# Patient Record
Sex: Female | Born: 1950
Health system: Southern US, Community
[De-identification: ages and names within clinical notes are randomized; demographics above are authoritative.]

## PROBLEM LIST (undated history)

## (undated) DIAGNOSIS — E7800 Pure hypercholesterolemia, unspecified: Secondary | ICD-10-CM

## (undated) DIAGNOSIS — I10 Essential (primary) hypertension: Secondary | ICD-10-CM

## (undated) DIAGNOSIS — G4730 Sleep apnea, unspecified: Secondary | ICD-10-CM

---

## 2004-11-16 ENCOUNTER — Ambulatory Visit: Payer: Self-pay

## 2005-09-14 DIAGNOSIS — I10 Essential (primary) hypertension: Secondary | ICD-10-CM | POA: Insufficient documentation

## 2005-11-10 ENCOUNTER — Ambulatory Visit: Payer: Self-pay | Admitting: Unknown Physician Specialty

## 2006-01-18 ENCOUNTER — Ambulatory Visit: Payer: Self-pay

## 2006-07-10 ENCOUNTER — Other Ambulatory Visit: Payer: Self-pay

## 2006-07-20 ENCOUNTER — Inpatient Hospital Stay: Payer: Self-pay

## 2006-08-22 HISTORY — PX: ABDOMINAL HYSTERECTOMY: SHX81

## 2007-02-13 ENCOUNTER — Ambulatory Visit: Payer: Self-pay

## 2008-02-15 ENCOUNTER — Ambulatory Visit: Payer: Self-pay

## 2009-02-16 ENCOUNTER — Ambulatory Visit: Payer: Self-pay

## 2010-03-04 ENCOUNTER — Ambulatory Visit: Payer: Self-pay

## 2011-03-31 ENCOUNTER — Ambulatory Visit: Payer: Self-pay

## 2013-01-25 ENCOUNTER — Ambulatory Visit: Payer: Self-pay | Admitting: Unknown Physician Specialty

## 2013-01-25 LAB — HM COLONOSCOPY

## 2013-01-28 LAB — PATHOLOGY REPORT

## 2013-03-15 ENCOUNTER — Ambulatory Visit: Payer: Self-pay

## 2013-12-26 ENCOUNTER — Ambulatory Visit: Payer: Self-pay | Admitting: Family Medicine

## 2014-03-18 LAB — HM PAP SMEAR: HM Pap smear: NEGATIVE

## 2014-04-02 ENCOUNTER — Ambulatory Visit: Payer: Self-pay

## 2014-09-23 LAB — LIPID PANEL
Cholesterol: 237 mg/dL — AB (ref 0–200)
HDL: 87 mg/dL — AB (ref 35–70)
LDL Cholesterol: 138 mg/dL
Triglycerides: 59 mg/dL (ref 40–160)

## 2014-09-23 LAB — HEMOGLOBIN A1C: Hgb A1c MFr Bld: 6.2 % — AB (ref 4.0–6.0)

## 2014-11-28 LAB — BASIC METABOLIC PANEL
BUN: 9 mg/dL (ref 4–21)
CREATININE: 0.8 mg/dL (ref 0.5–1.1)
Glucose: 112 mg/dL
Potassium: 4.1 mmol/L (ref 3.4–5.3)
Sodium: 143 mmol/L (ref 137–147)

## 2015-02-01 ENCOUNTER — Other Ambulatory Visit: Payer: Self-pay | Admitting: Family Medicine

## 2015-03-23 ENCOUNTER — Telehealth: Payer: Self-pay | Admitting: *Deleted

## 2015-03-23 NOTE — Telephone Encounter (Signed)
Patient called office wanting to know if the form she drop off last week is ready to pick-up?

## 2015-03-23 NOTE — Telephone Encounter (Signed)
What was the form for?

## 2015-03-24 NOTE — Telephone Encounter (Signed)
Patient is going to fax another copy of form to office. Patient stated that form was for consent, but did give reason for what the form was for.

## 2015-03-25 NOTE — Telephone Encounter (Signed)
Patient will pick up forms.

## 2015-05-26 DIAGNOSIS — I491 Atrial premature depolarization: Secondary | ICD-10-CM | POA: Insufficient documentation

## 2015-05-26 DIAGNOSIS — E7800 Pure hypercholesterolemia, unspecified: Secondary | ICD-10-CM | POA: Insufficient documentation

## 2015-05-26 DIAGNOSIS — J309 Allergic rhinitis, unspecified: Secondary | ICD-10-CM | POA: Insufficient documentation

## 2015-05-26 DIAGNOSIS — Z833 Family history of diabetes mellitus: Secondary | ICD-10-CM | POA: Insufficient documentation

## 2015-05-26 DIAGNOSIS — E559 Vitamin D deficiency, unspecified: Secondary | ICD-10-CM | POA: Insufficient documentation

## 2015-05-26 DIAGNOSIS — R7303 Prediabetes: Secondary | ICD-10-CM | POA: Insufficient documentation

## 2015-05-26 DIAGNOSIS — R21 Rash and other nonspecific skin eruption: Secondary | ICD-10-CM | POA: Insufficient documentation

## 2015-05-27 ENCOUNTER — Encounter: Payer: Self-pay | Admitting: Family Medicine

## 2015-05-27 ENCOUNTER — Ambulatory Visit (INDEPENDENT_AMBULATORY_CARE_PROVIDER_SITE_OTHER): Payer: BC Managed Care – PPO | Admitting: Family Medicine

## 2015-05-27 VITALS — BP 116/70 | HR 72 | Temp 98.8°F | Resp 16 | Wt 169.0 lb

## 2015-05-27 DIAGNOSIS — J309 Allergic rhinitis, unspecified: Secondary | ICD-10-CM | POA: Diagnosis not present

## 2015-05-27 DIAGNOSIS — E559 Vitamin D deficiency, unspecified: Secondary | ICD-10-CM | POA: Diagnosis not present

## 2015-05-27 DIAGNOSIS — Z23 Encounter for immunization: Secondary | ICD-10-CM

## 2015-05-27 DIAGNOSIS — I10 Essential (primary) hypertension: Secondary | ICD-10-CM

## 2015-05-27 DIAGNOSIS — R002 Palpitations: Secondary | ICD-10-CM | POA: Insufficient documentation

## 2015-05-27 DIAGNOSIS — R7303 Prediabetes: Secondary | ICD-10-CM | POA: Diagnosis not present

## 2015-05-27 LAB — HEMOGLOBIN A1C
Est. average glucose Bld gHb Est-mCnc: 123
Hemoglobin A1C: 5.9

## 2015-05-27 NOTE — Progress Notes (Signed)
Patient: Beverly Bush Female    DOB: 1951-05-10   64 y.o.   MRN: [id number] Visit Date: 05/27/2015  Today's Provider: Mila Merry, MD   Chief Complaint  Patient presents with  . Hypertension  . Allergic Rhinitis   . Vitamin d deficiency  . Hyperglycemia   Subjective:    HPI   Follow-up for vitamin D deficiency.   Last vitamin d in April had improved from 15.1 to 23.7 on vitamin d supplements. She is taking every day and tolerating without any adverse effects.    Follow-up for allergic rhinitis Has been doing well with Flonase. Has not had to use yet this fall, but was effective without adverse effects during the spring allergy season.     Hypertension, follow-up:  BP Readings from Last 3 Encounters:  05/27/15 116/70  11/28/14 120/80    She was last seen for hypertension 6 months ago.  BP at that visit was 138/70. Management since that visit includes none. She reports good compliance with treatment. She is not having side effects. none  She is exercising. She is adherent to low salt diet.   Outside blood pressures are n/a. She is experiencing none.  Patient denies palpitations.   Cardiovascular risk factors include none.  Use of agents associated with hypertension: none.     Weight trend: stable Wt Readings from Last 3 Encounters:  05/27/15 169 lb (76.658 kg)  11/28/14 170 lb (77.111 kg)    Current diet: in general, a "healthy" diet    ---------------------------------------------------------------------   Prediabetes, Follow-up:   Lab Results  Component Value Date   HGBA1C 6.2* 09/23/2014    She continue to consume healthy diet low on sweets and carbs.   Pertinent Labs:    Component Value Date/Time   CHOL 237* 09/23/2014   TRIG 59 09/23/2014   CREATININE 0.8 11/28/2014    Wt Readings from Last 3 Encounters:  05/27/15 169 lb (76.658 kg)  11/28/14 170 lb (77.111 kg)      No Known Allergies Previous Medications   ERGOCALCIFEROL (VITAMIN D2) 2000 UNITS TABS    Take 1 tablet by mouth daily.   FLUTICASONE (FLONASE) 50 MCG/ACT NASAL SPRAY    Place into the nose.   VALSARTAN-HYDROCHLOROTHIAZIDE (DIOVAN-HCT) 160-25 MG PER TABLET    Take 1 tablet by mouth daily.    Review of Systems  Constitutional: Negative for fever, chills, appetite change and fatigue.  Respiratory: Negative for chest tightness and shortness of breath.   Cardiovascular: Positive for palpitations. Negative for chest pain.  Gastrointestinal: Negative for nausea, vomiting and abdominal pain.  Neurological: Negative for dizziness, weakness and light-headedness.    Social History  Substance Use Topics  . Smoking status: Never Smoker   . Smokeless tobacco: Never Used  . Alcohol Use: No   Objective:   BP 116/70 mmHg  Pulse 72  Temp(Src) 98.8 F (37.1 C) (Oral)  Resp 16  Wt 169 lb (76.658 kg)  SpO2 97%  Physical Exam  General Appearance:    Alert, cooperative, no distress  HENT:   ENT exam normal, no neck nodes or sinus tenderness  Eyes:    PERRL, conjunctiva/corneas clear, EOM's intact       Lungs:     Clear to auscultation bilaterally, respirations unlabored  Heart:    Regular rate and rhythm  Neurologic:   Awake, alert, oriented x 3. No apparent focal neurological           defect.  Results for orders placed or performed in visit on 05/27/15  Hemoglobin A1c  Result Value Ref Range   Hemoglobin A1C 5.9    Est. average glucose Bld gHb Est-mCnc 123     Assessment & Plan:     1. Hypertension, essential, benign Fairly well controlled. Continue current medications.    2. Pre-diabetes Well controlled.  Continue current diet and exercise.  - Hemoglobin A1c  3. Vitamin D deficiency Doing well on current supplements  4. Allergic rhinitis, unspecified allergic rhinitis type Doing well with seasonal use of Flonase.   5. Need for influenza vaccination  - Flu Vaccine QUAD 36+ mos IM  Return in about 6 months  (around 11/25/2015).        Mila Merry, MD  [facility]

## 2015-09-01 ENCOUNTER — Other Ambulatory Visit: Payer: Self-pay | Admitting: Family Medicine

## 2015-09-01 MED ORDER — VALSARTAN-HYDROCHLOROTHIAZIDE 160-25 MG PO TABS: 1.0000 | ORAL_TABLET | Freq: Every day | ORAL | Status: DC

## 2015-09-01 NOTE — Telephone Encounter (Signed)
Pt stated with her insurance now she needs her medication sent to [facility] mail order. Pt would like a refill for valsartan-hydrochlorothiazide (DIOVAN-HCT) 160-25 MG per tablet sent in. Thanks TNP

## 2015-11-16 ENCOUNTER — Encounter: Payer: Self-pay | Admitting: Family Medicine

## 2015-11-16 ENCOUNTER — Ambulatory Visit (INDEPENDENT_AMBULATORY_CARE_PROVIDER_SITE_OTHER): Payer: BC Managed Care – PPO | Admitting: Family Medicine

## 2015-11-16 VITALS — BP 124/64 | HR 98 | Temp 100.9°F | Resp 16 | Wt 174.0 lb

## 2015-11-16 DIAGNOSIS — L03312 Cellulitis of back [any part except buttock]: Secondary | ICD-10-CM | POA: Diagnosis not present

## 2015-11-16 DIAGNOSIS — R509 Fever, unspecified: Secondary | ICD-10-CM

## 2015-11-16 LAB — POCT INFLUENZA A/B
INFLUENZA A, POC: NEGATIVE
Influenza B, POC: NEGATIVE

## 2015-11-16 MED ORDER — AMOXICILLIN-POT CLAVULANATE 875-125 MG PO TABS: 1.0000 | ORAL_TABLET | Freq: Two times a day (BID) | ORAL | Status: AC

## 2015-11-16 NOTE — Progress Notes (Signed)
       Patient: Beverly Bush Female    DOB: [birth year]   65 y.o.   MRN: [id number] Visit Date: 11/16/2015  Today's Provider: Mila Merryonald Warrick Llera, MD   Chief Complaint  Patient presents with  . Cough   Subjective:    Cough This is a new problem. The current episode started yesterday. The problem has been gradually worsening. The problem occurs hourly. The cough is productive of sputum. Associated symptoms include chills, a fever, headaches, a sore throat and sweats. Pertinent negatives include no chest pain, ear congestion, ear pain, heartburn, hemoptysis, myalgias, nasal congestion, postnasal drip, rash, rhinorrhea, shortness of breath or wheezing. Nothing aggravates the symptoms. Treatments tried: aleve. The treatment provided mild relief. There is no history of bronchitis or pneumonia.    Cough started yesterday. Throat hurts when she cough. She does have a low-grade fever. Cyst under left arm infected and very sore.  No Known Allergies Previous Medications   ERGOCALCIFEROL (VITAMIN D2) 2000 UNITS TABS    Take 1 tablet by mouth daily.   FLUTICASONE (FLONASE) 50 MCG/ACT NASAL SPRAY    Place into the nose.   VALSARTAN-HYDROCHLOROTHIAZIDE (DIOVAN-HCT) 160-25 MG TABLET    Take 1 tablet by mouth daily.    Review of Systems  Constitutional: Positive for fever and chills. Negative for appetite change and fatigue.  HENT: Positive for sore throat. Negative for ear pain, postnasal drip and rhinorrhea.   Respiratory: Positive for cough and chest tightness. Negative for hemoptysis, shortness of breath and wheezing.   Cardiovascular: Negative for chest pain and palpitations.  Gastrointestinal: Negative for heartburn, nausea, vomiting and abdominal pain.  Musculoskeletal: Negative for myalgias.  Skin: Negative for rash.  Neurological: Positive for headaches. Negative for dizziness and weakness.    Social History  Substance Use Topics  . Smoking status: Never Smoker   . Smokeless tobacco:  Never Used  . Alcohol Use: No   Objective:   BP 124/64 mmHg  Pulse 98  Temp(Src) 100.9 F (38.3 C) (Oral)  Resp 16  Wt 174 lb (78.926 kg)  SpO2 97%  Physical Exam  General Appearance:    Alert, cooperative, no distress  HENT:   both sides TM normal without fluid or infection, neck without nodes, sinuses nontender and nasal mucosa pale and congested  Eyes:    PERRL, conjunctiva/corneas clear, EOM's intact       Lungs:     Clear to auscultation bilaterally, respirations unlabored  Heart:    Regular rate and rhythm  Skin:   About 4cm patch of erythema with grape sized subcutaneous cystic lesion, tender to touch.       Results for orders placed or performed in visit on 11/16/15  POCT Influenza A/B  Result Value Ref Range   Influenza A, POC Negative Negative   Influenza B, POC Negative Negative         Assessment & Plan:     1. Fever, unspecified fever cause  - POCT Influenza A/B  2. Cellulitis of back except buttock  - amoxicillin-clavulanate (AUGMENTIN) 875-125 MG tablet; Take 1 tablet by mouth 2 (two) times daily.  Dispense: 20 tablet; Refill: 0  Call if symptoms change or if not rapidly improving.          Mila Merryonald Sparrow Sanzo, MD  [facility]

## 2015-11-17 ENCOUNTER — Telehealth: Payer: Self-pay | Admitting: Family Medicine

## 2015-11-17 MED ORDER — FLUTICASONE PROPIONATE 50 MCG/ACT NA SUSP: 2.0000 | Freq: Every day | NASAL | Status: DC

## 2015-11-17 MED ORDER — BENZONATATE 100 MG PO CAPS: 100.0000 mg | ORAL_CAPSULE | Freq: Two times a day (BID) | ORAL | Status: DC | PRN

## 2015-11-17 NOTE — Telephone Encounter (Signed)
Pt wants to know if she can get flonase and cough mediation/  She was in yesterday.  Coughing a lot.  [facility]  Her call back is [phone]  Thanks Barth Kirkseri

## 2015-11-17 NOTE — Telephone Encounter (Signed)
Is this okay? Beverly Bush, CMA  

## 2015-11-17 NOTE — Telephone Encounter (Signed)
[facility] 

## 2016-06-01 ENCOUNTER — Other Ambulatory Visit: Payer: Self-pay | Admitting: Obstetrics & Gynecology

## 2016-06-01 DIAGNOSIS — Z1231 Encounter for screening mammogram for malignant neoplasm of breast: Secondary | ICD-10-CM

## 2016-06-09 ENCOUNTER — Ambulatory Visit
Admission: RE | Admit: 2016-06-09 | Discharge: 2016-06-09 | Disposition: A | Discharge status: Home / Self Care | Payer: BC Managed Care – PPO | Source: Ambulatory Visit | Attending: Obstetrics & Gynecology | Admitting: Obstetrics & Gynecology

## 2016-06-09 DIAGNOSIS — Z1231 Encounter for screening mammogram for malignant neoplasm of breast: Secondary | ICD-10-CM | POA: Diagnosis not present

## 2016-06-09 LAB — HM MAMMOGRAPHY

## 2016-06-28 ENCOUNTER — Ambulatory Visit (INDEPENDENT_AMBULATORY_CARE_PROVIDER_SITE_OTHER): Payer: BC Managed Care – PPO | Admitting: Family Medicine

## 2016-06-28 ENCOUNTER — Encounter: Payer: Self-pay | Admitting: Family Medicine

## 2016-06-28 VITALS — BP 110/70 | HR 84 | Temp 98.3°F | Resp 16 | Ht 62.0 in | Wt 180.0 lb

## 2016-06-28 DIAGNOSIS — Z0000 Encounter for general adult medical examination without abnormal findings: Secondary | ICD-10-CM

## 2016-06-28 DIAGNOSIS — R7303 Prediabetes: Secondary | ICD-10-CM

## 2016-06-28 DIAGNOSIS — Z23 Encounter for immunization: Secondary | ICD-10-CM | POA: Diagnosis not present

## 2016-06-28 DIAGNOSIS — G4710 Hypersomnia, unspecified: Secondary | ICD-10-CM

## 2016-06-28 DIAGNOSIS — E559 Vitamin D deficiency, unspecified: Secondary | ICD-10-CM

## 2016-06-28 DIAGNOSIS — R002 Palpitations: Secondary | ICD-10-CM

## 2016-06-28 DIAGNOSIS — E7800 Pure hypercholesterolemia, unspecified: Secondary | ICD-10-CM

## 2016-06-28 DIAGNOSIS — R0683 Snoring: Secondary | ICD-10-CM

## 2016-06-28 DIAGNOSIS — I10 Essential (primary) hypertension: Secondary | ICD-10-CM

## 2016-06-28 NOTE — Progress Notes (Signed)
Patient: Beverly PattenDoris A Timme, Female    DOB: May 29, 1951, 65 y.o.   MRN: [id number] Visit Date: 06/28/2016  Today's Provider: Mila Merryonald Fisher, MD   Chief Complaint  Patient presents with  . Annual Exam  . Hypertension  . Hyperlipidemia   Subjective:    Annual physical exam Beverly PattenDoris A Pilot is a 65 y.o. female who presents today for health maintenance and complete physical. She feels well. She reports exercising none. She reports she is sleeping well.  She goes to [facility] and reports she has pap and mammogram done last month.   ----------------------------------------------------------------  Pre-diabetes From 05/27/2015-Well controlled.  Continue current diet and exercise.    Vitamin D deficiency From 05/27/2015-Doing well on current supplements   Hypertension:  BP Readings from Last 3 Encounters:  06/28/16 110/70  11/16/15 124/64  05/27/15 116/70    She was last seen for hypertension 1 years ago.  BP at that visit was 116/70. Management since that visit includes; no changes.She reports good compliance with treatment. She is not having side effects. none She is not exercising. She is not adherent to low salt diet.   Outside blood pressures are n/a. She is experiencing none.  Patient denies none.   Cardiovascular risk factors include prediabetes.  Use of agents associated with hypertension: none.   ----------------------------------------------------------------    Lipid/Cholesterol, Follow-up:   Last seen for this 1 years ago.  Management since that visit includes; advised to cut back on saturated fats.  Last Lipid Panel:    Component Value Date/Time   CHOL 237 (A) 09/23/2014   TRIG 59 09/23/2014   HDL 87 (A) 09/23/2014   LDLCALC 138 09/23/2014    She reports good compliance with treatment. She is not having side effects. none  Wt Readings from Last 3 Encounters:  06/28/16 180 lb (81.6 kg)  11/16/15 174 lb (78.9 kg)  05/27/15 169 lb (76.7  kg)    ----------------------------------------------------------------     Review of Systems  Constitutional: Positive for diaphoresis.  HENT: Negative.   Eyes: Negative.   Respiratory: Positive for cough.   Cardiovascular: Positive for palpitations.  Gastrointestinal: Negative.   Endocrine: Positive for polyuria.  Genitourinary: Negative.   Musculoskeletal: Negative.   Skin: Negative.   Allergic/Immunologic: Negative.   Neurological: Negative.   Hematological: Negative.   Psychiatric/Behavioral: Negative.     Social History      She  reports that she has never smoked. She has never used smokeless tobacco. She reports that she does not drink alcohol or use drugs.       Social History   Social History  . Marital status: Married    Spouse name: N/A  . Number of children: 2  . Years of education: College Gr   Social History Main Topics  . Smoking status: Never Smoker  . Smokeless tobacco: Never Used  . Alcohol use No  . Drug use: No  . Sexual activity: Not Asked   Other Topics Concern  . None   Social History Narrative  . None    History reviewed. No pertinent past medical history.   Patient Active Problem List   Diagnosis Date Noted  . Palpitations 05/27/2015  . Allergic rhinitis 05/26/2015  . Family history of diabetes mellitus 05/26/2015  . Ectopic atrial beats 05/26/2015  . Pre-diabetes 05/26/2015  . Pure hypercholesterolemia 05/26/2015  . Vitamin D deficiency 05/26/2015  . Depressive disorder 04/24/2009  . Urge incontinence 04/22/2008  . Postprocedural state 01/30/2007  .  Hypertension, essential, benign 09/14/2005    Past Surgical History:  Procedure Laterality Date  . ABDOMINAL HYSTERECTOMY  2008   Due to heavy bleeding. Complete; does not have ovaries per patient    Family History        Family Status  Relation Status  . Mother Alive  . Father Alive  . Sister Alive  . Sister Alive        Her family history includes Breast cancer  in her sister; Cancer in her sister and sister; Diabetes in her mother; Hypertension in her father.     No Known Allergies   Current Outpatient Prescriptions:  .  benzonatate (TESSALON) 100 MG capsule, Take 1 capsule (100 mg total) by mouth 2 (two) times daily as needed for cough., Disp: 20 capsule, Rfl: 0 .  Ergocalciferol (VITAMIN D2) 2000 UNITS TABS, Take 1 tablet by mouth daily., Disp: , Rfl:  .  fluticasone (FLONASE) 50 MCG/ACT nasal spray, Place 2 sprays into both nostrils daily., Disp: 16 g, Rfl: 1 .  valsartan-hydrochlorothiazide (DIOVAN-HCT) 160-25 MG tablet, Take 1 tablet by mouth daily., Disp: 90 tablet, Rfl: 4   Patient Care Team: Malva Limes, MD as PCP - General (Family Medicine)      Objective:   Vitals: BP 110/70 (BP Location: Left Arm, Patient Position: Sitting, Cuff Size: Normal)   Pulse 84   Temp 98.3 F (36.8 C) (Oral)   Resp 16   Ht 5\' 2"  (1.575 m)   Wt 180 lb (81.6 kg)   SpO2 98%   BMI 32.92 kg/m    Physical Exam   General Appearance:    Alert, cooperative, no distress, appears stated age, overweight  Head:    Normocephalic, without obvious abnormality, atraumatic  Eyes:    PERRL, conjunctiva/corneas clear, EOM's intact, fundi    benign, both eyes  Ears:    Normal TM's and external ear canals, both ears  Nose:   Nares normal, septum midline, mucosa normal, no drainage    or sinus tenderness  Throat:   Lips, mucosa, and tongue normal; teeth and gums normal  Neck:   Supple, symmetrical, trachea midline, no adenopathy;    thyroid:  no enlargement/tenderness/nodules; no carotid   bruit or JVD  Back:     Symmetric, no curvature, ROM normal, no CVA tenderness  Lungs:     Clear to auscultation bilaterally, respirations unlabored  Chest Wall:    No tenderness or deformity   Heart:    Regular rate and rhythm, S1 and S2 normal, no murmur, rub   or gallop  Breast Exam:    deferred (had gyn)  Abdomen:     Soft, non-tender, bowel sounds active all four  quadrants,    no masses, no organomegaly  Pelvic:    deferred (has gyn)  Extremities:   Extremities normal, atraumatic, no cyanosis or edema  Pulses:   2+ and symmetric all extremities  Skin:   Skin color, texture, turgor normal, no rashes or lesions  Lymph nodes:   Cervical, supraclavicular, and axillary nodes normal  Neurologic:   CNII-XII intact, normal strength, sensation and reflexes    throughout    Depression Screen PHQ 2/9 Scores 06/28/2016  PHQ - 2 Score 1  PHQ- 9 Score 3    Epworth=17  Assessment & Plan:     Routine Health Maintenance and Physical Exam  Exercise Activities and Dietary recommendations Goals    None      Immunization History  Administered Date(s) Administered  .  Influenza,inj,Quad PF,36+ Mos 05/27/2015  . Tdap 04/22/2008  . Zoster 09/23/2014    Health Maintenance  Topic Date Due  . HIV Screening  06/07/1968  . COLONOSCOPY  01/25/2018  . TETANUS/TDAP  04/22/2018  . MAMMOGRAM  06/09/2018  . INFLUENZA VACCINE  Completed  . ZOSTAVAX  Completed  . Hepatitis C Screening  Completed     Discussed health benefits of physical activity, and encouraged her to engage in regular exercise appropriate for her age and condition.    --------------------------------------------------------------------  1. Annual physical exam  - Comprehensive metabolic panel  2. Need for influenza vaccination  - Flu Vaccine QUAD 36+ mos IM  3. Hypertension, essential, benign Well controlled.  Continue current medications.   - EKG 12-Lead  4. Vitamin D deficiency  - VITAMIN D 25 Hydroxy (Vit-D Deficiency, Fractures)  5. Pure hypercholesterolemia On low fat diet - Lipid panel  6. Pre-diabetes  - Hemoglobin A1c  7. Palpitations  - T4 AND TSH  8. Snoring   9. Hypersomnia  - Polysomnography 4 or more parameters; Future    Mila Merryonald Fisher, MD  [facility]

## 2016-06-30 ENCOUNTER — Telehealth: Payer: Self-pay

## 2016-06-30 LAB — LIPID PANEL
CHOLESTEROL TOTAL: 211 mg/dL — AB (ref 100–199)
Chol/HDL Ratio: 2.9 ratio units (ref 0.0–4.4)
HDL: 74 mg/dL (ref >39)
LDL Calculated: 125 mg/dL — ABNORMAL HIGH (ref 0–99)
Triglycerides: 62 mg/dL (ref 0–149)
VLDL CHOLESTEROL CAL: 12 mg/dL (ref 5–40)

## 2016-06-30 LAB — COMPREHENSIVE METABOLIC PANEL
ALBUMIN: 3.9 g/dL (ref 3.6–4.8)
ALK PHOS: 61 IU/L (ref 39–117)
ALT: 19 IU/L (ref 0–32)
AST: 19 IU/L (ref 0–40)
Albumin/Globulin Ratio: 1.4 (ref 1.2–2.2)
BILIRUBIN TOTAL: 0.8 mg/dL (ref 0.0–1.2)
BUN / CREAT RATIO: 20 (ref 12–28)
BUN: 17 mg/dL (ref 8–27)
CHLORIDE: 105 mmol/L (ref 96–106)
CO2: 24 mmol/L (ref 18–29)
Calcium: 9.1 mg/dL (ref 8.7–10.3)
Creatinine, Ser: 0.85 mg/dL (ref 0.57–1.00)
GFR calc Af Amer: 84 mL/min/{1.73_m2} (ref >59)
GFR calc non Af Amer: 73 mL/min/{1.73_m2} (ref >59)
GLOBULIN, TOTAL: 2.8 g/dL (ref 1.5–4.5)
Glucose: 121 mg/dL — ABNORMAL HIGH (ref 65–99)
Potassium: 4.2 mmol/L (ref 3.5–5.2)
SODIUM: 144 mmol/L (ref 134–144)
Total Protein: 6.7 g/dL (ref 6.0–8.5)

## 2016-06-30 LAB — VITAMIN D 25 HYDROXY (VIT D DEFICIENCY, FRACTURES): VIT D 25 HYDROXY: 34.4 ng/mL (ref 30.0–100.0)

## 2016-06-30 LAB — T4 AND TSH
T4 TOTAL: 7 ug/dL (ref 4.5–12.0)
TSH: 3.16 u[IU]/mL (ref 0.450–4.500)

## 2016-06-30 LAB — HEMOGLOBIN A1C
ESTIMATED AVERAGE GLUCOSE: 126 mg/dL
HEMOGLOBIN A1C: 6 % — AB (ref 4.8–5.6)

## 2016-06-30 NOTE — Telephone Encounter (Signed)
-----   Message from Malva Limesonald E Fisher, MD sent at 06/30/2016  8:28 AM EST ----- Cholesterol slightly elevated at 211. hgba1c is stable at 6.0 indicating average blood sugar of 126. All other labs are completley normal. Continue current medications.  Check labs yearly.

## 2016-06-30 NOTE — Telephone Encounter (Signed)
Pt advised.   Thanks,   -Laura  

## 2016-06-30 NOTE — Telephone Encounter (Signed)
LMTCB 06/30/2016  Thanks,   -Laura  

## 2016-09-09 ENCOUNTER — Ambulatory Visit: Payer: BC Managed Care – PPO | Attending: Otolaryngology

## 2016-09-09 DIAGNOSIS — I10 Essential (primary) hypertension: Secondary | ICD-10-CM | POA: Insufficient documentation

## 2016-09-09 DIAGNOSIS — G4700 Insomnia, unspecified: Secondary | ICD-10-CM | POA: Insufficient documentation

## 2016-09-09 DIAGNOSIS — G4733 Obstructive sleep apnea (adult) (pediatric): Secondary | ICD-10-CM | POA: Diagnosis not present

## 2016-09-16 ENCOUNTER — Telehealth: Payer: Self-pay | Admitting: Family Medicine

## 2016-09-16 DIAGNOSIS — G4733 Obstructive sleep apnea (adult) (pediatric): Secondary | ICD-10-CM | POA: Insufficient documentation

## 2016-09-16 NOTE — Telephone Encounter (Signed)
Sleep test shows severe sleep apnea. Needs CPAP titrate study. Have entered order and should hear from sarah about this within the next week.

## 2016-09-19 NOTE — Telephone Encounter (Signed)
Patient was notified of results. Please schedule CPAP titrate study. Thanks!

## 2016-09-19 NOTE — Telephone Encounter (Signed)
No answer and unable to leave a message

## 2016-09-21 ENCOUNTER — Encounter: Payer: Self-pay | Admitting: Family Medicine

## 2016-09-21 NOTE — Telephone Encounter (Signed)
Order for CPAP titration faxed to [facility] 09/20/16

## 2016-09-23 ENCOUNTER — Encounter: Payer: Self-pay | Admitting: Family Medicine

## 2016-10-17 ENCOUNTER — Other Ambulatory Visit: Payer: Self-pay

## 2016-10-17 DIAGNOSIS — I10 Essential (primary) hypertension: Secondary | ICD-10-CM

## 2016-10-17 MED ORDER — VALSARTAN-HYDROCHLOROTHIAZIDE 160-25 MG PO TABS: 1.0000 | ORAL_TABLET | Freq: Every day | ORAL | 4 refills | Status: DC

## 2016-10-17 NOTE — Telephone Encounter (Signed)
Pharmacy sent fax requesting refill. 

## 2016-10-18 ENCOUNTER — Ambulatory Visit: Payer: BC Managed Care – PPO | Attending: Otolaryngology

## 2016-10-18 DIAGNOSIS — R0683 Snoring: Secondary | ICD-10-CM | POA: Insufficient documentation

## 2016-10-18 DIAGNOSIS — G4733 Obstructive sleep apnea (adult) (pediatric): Secondary | ICD-10-CM | POA: Insufficient documentation

## 2016-10-25 ENCOUNTER — Encounter: Payer: Self-pay | Admitting: Family Medicine

## 2016-10-27 ENCOUNTER — Telehealth: Payer: Self-pay | Admitting: Family Medicine

## 2016-10-27 NOTE — Telephone Encounter (Signed)
error 

## 2016-11-09 ENCOUNTER — Other Ambulatory Visit: Payer: Self-pay | Admitting: Family Medicine

## 2016-11-09 MED ORDER — VALSARTAN-HYDROCHLOROTHIAZIDE 160-25 MG PO TABS: 1.0000 | ORAL_TABLET | Freq: Every day | ORAL | 4 refills | Status: DC

## 2016-11-09 NOTE — Telephone Encounter (Signed)
Pt stated that the Rx for valsartan-hydrochlorothiazide (DIOVAN-HCT) 160-25 MG tablet [facility] and it should have been [facility]. Please advise. Thanks TNP

## 2017-03-28 ENCOUNTER — Telehealth: Payer: Self-pay | Admitting: Family Medicine

## 2017-03-28 MED ORDER — TELMISARTAN-HCTZ 80-25 MG PO TABS: 1.0000 | ORAL_TABLET | Freq: Every day | ORAL | 0 refills | Status: DC

## 2017-03-28 NOTE — Telephone Encounter (Signed)
Have sent prescription for telmisartan-hctz to [facility] to try in its place. If she is tolerating this after 2 weeks, she should call for mail order prescription.

## 2017-03-28 NOTE — Telephone Encounter (Signed)
Called both numbers no answer and unable to leave a message-aa

## 2017-03-28 NOTE — Telephone Encounter (Signed)
Pt called about the Valsartin recall wanting to know what she needs to do regarding this prescription.  Her call back [phone] or [phone]  Thanks Barth Kirksteri

## 2017-03-29 NOTE — Telephone Encounter (Signed)
Pt advised as below-aa 

## 2017-04-25 ENCOUNTER — Telehealth: Payer: Self-pay | Admitting: Family Medicine

## 2017-04-25 NOTE — Telephone Encounter (Signed)
Tried calling patient to advise as below. Unable to leave a message due to VM being full.

## 2017-04-25 NOTE — Telephone Encounter (Signed)
Please call pharmacy to clarify. I don't understand which medication pharmacist was talking about. . She can go back to valsartan-hctz if pharmacy has some that are not on recall list.

## 2017-04-25 NOTE — Telephone Encounter (Signed)
Called [facility] to clarify and spoke with Velna HatchetSheila (the pharmacist). She reports that Valsartan was recalled, but they now have a new manufacturer and it is ok to take now. She reports that patient can continue to take her original Rx.

## 2017-04-25 NOTE — Telephone Encounter (Signed)
Pt states she received Valsartan in the mail with her mail order RX.  She states she was told to not take it due to recall and she was started on a new medication.  When the pt called the pharmacy they told her they have ordered from another manufacturer. Pt is confused on what medication to take and if she should continue Valsartan or not.

## 2017-04-25 NOTE — Telephone Encounter (Signed)
Please advise 

## 2017-05-04 NOTE — Telephone Encounter (Signed)
Patient is aware. Patient stated that she is still taking generic Micardis HCT for now but she will eventually go back to valsartan-hct.

## 2017-06-27 ENCOUNTER — Encounter: Payer: Self-pay | Admitting: Obstetrics & Gynecology

## 2017-06-27 ENCOUNTER — Ambulatory Visit (INDEPENDENT_AMBULATORY_CARE_PROVIDER_SITE_OTHER): Payer: BC Managed Care – PPO | Admitting: Obstetrics & Gynecology

## 2017-06-27 VITALS — BP 120/80 | HR 78 | Ht 62.0 in | Wt 177.0 lb

## 2017-06-27 DIAGNOSIS — Z124 Encounter for screening for malignant neoplasm of cervix: Secondary | ICD-10-CM

## 2017-06-27 DIAGNOSIS — N3941 Urge incontinence: Secondary | ICD-10-CM

## 2017-06-27 DIAGNOSIS — Z0000 Encounter for general adult medical examination without abnormal findings: Secondary | ICD-10-CM

## 2017-06-27 DIAGNOSIS — Z1231 Encounter for screening mammogram for malignant neoplasm of breast: Secondary | ICD-10-CM | POA: Diagnosis not present

## 2017-06-27 DIAGNOSIS — Z1239 Encounter for other screening for malignant neoplasm of breast: Secondary | ICD-10-CM

## 2017-06-27 DIAGNOSIS — Z01419 Encounter for gynecological examination (general) (routine) without abnormal findings: Secondary | ICD-10-CM

## 2017-06-27 DIAGNOSIS — Z1211 Encounter for screening for malignant neoplasm of colon: Secondary | ICD-10-CM

## 2017-06-27 NOTE — Patient Instructions (Signed)
PAP every 5 years Mammogram every year    Call [phone] to schedule at [facility] every 5 years Labs yearly (with PCP)   

## 2017-06-27 NOTE — Progress Notes (Signed)
HPI:      [name] is a 66 y.o. G3P2 who LMP was in the past, she presents today for her annual examination.  The patient has no complaints today. The patient is not sexually active. Herlast pap: was normal and last mammogram: approximate date 2017 and was normal.  The patient does perform self breast exams.  There is no notable family history of breast or ovarian cancer in her family. The patient is not taking hormone replacement therapy. Patient denies post-menopausal vaginal bleeding.   The patient has regular exercise: yes. The patient denies current symptoms of depression.    GYN Hx: Last Colonoscopy:4 years ago. Normal.  Last DEXA: never ago.    PMHx: History reviewed. No pertinent past medical history. Past Surgical History:  Procedure Laterality Date  . ABDOMINAL HYSTERECTOMY  2008   Due to heavy bleeding. Complete; does not have ovaries per patient   Family History  Problem Relation Age of Onset  . Diabetes Mother        non insulin dependant diabetes mellitus  . Hypertension Father   . Cancer Sister        breast  . Breast cancer Sister   . Cancer Sister        colon cancer  . Breast cancer Sister    Social History   Tobacco Use  . Smoking status: Never Smoker  . Smokeless tobacco: Never Used  Substance Use Topics  . Alcohol use: No  . Drug use: No    Current Outpatient Medications:  .  Ergocalciferol (VITAMIN D2) 2000 UNITS TABS, Take 1 tablet by mouth daily., Disp: , Rfl:  .  fluticasone (FLONASE) 50 MCG/ACT nasal spray, Place 2 sprays into both nostrils daily., Disp: 16 g, Rfl: 1 .  telmisartan-hydrochlorothiazide (MICARDIS HCT) 80-25 MG tablet, Take 1 tablet by mouth daily., Disp: 30 tablet, Rfl: 0 .  valsartan-hydrochlorothiazide (DIOVAN-HCT) 160-25 MG tablet, Take 1 tablet by mouth daily., Disp: 90 tablet, Rfl: 4 Allergies: Patient has no known allergies.  Review of Systems  Constitutional: Negative for chills, fever and malaise/fatigue.    HENT: Negative for congestion, sinus pain and sore throat.   Eyes: Negative for blurred vision and pain.  Respiratory: Negative for cough and wheezing.   Cardiovascular: Negative for chest pain and leg swelling.  Gastrointestinal: Negative for abdominal pain, constipation, diarrhea, heartburn, nausea and vomiting.  Genitourinary: Negative for dysuria, frequency, hematuria and urgency.  Musculoskeletal: Negative for back pain, joint pain, myalgias and neck pain.  Skin: Negative for itching and rash.  Neurological: Negative for dizziness, tremors and weakness.  Endo/Heme/Allergies: Does not bruise/bleed easily.  Psychiatric/Behavioral: Negative for depression. The patient is not nervous/anxious and does not have insomnia.    Objective: BP 120/80   Pulse 78   Ht 5\' 2"  (1.575 m)   Wt 177 lb (80.3 kg)   BMI 32.37 kg/m   Filed Weights   06/27/17 1440  Weight: 177 lb (80.3 kg)   Body mass index is 32.37 kg/m. Physical Exam  Constitutional: She is oriented to person, place, and time. She appears well-developed and well-nourished. No distress.  Genitourinary: Rectum normal and vagina normal. Pelvic exam was performed with patient supine. There is no rash or lesion on the right labia. There is no rash or lesion on the left labia. Vagina exhibits no lesion. No bleeding in the vagina. Right adnexum does not display mass and does not display tenderness. Left adnexum does not display mass and does not display  tenderness.  Genitourinary Comments: Absent Uterus Absent cervix Vaginal cuff well healed  HENT:  Head: Normocephalic and atraumatic. Head is without laceration.  Right Ear: Hearing normal.  Left Ear: Hearing normal.  Nose: No epistaxis.  No foreign bodies.  Mouth/Throat: Uvula is midline, oropharynx is clear and moist and mucous membranes are normal.  Eyes: Pupils are equal, round, and reactive to light.  Neck: Normal range of motion. Neck supple. No thyromegaly present.   Cardiovascular: Normal rate and regular rhythm. Exam reveals no gallop and no friction rub.  No murmur heard. Pulmonary/Chest: Effort normal and breath sounds normal. No respiratory distress. She has no wheezes. Right breast exhibits no mass, no skin change and no tenderness. Left breast exhibits no mass, no skin change and no tenderness.  Abdominal: Soft. Bowel sounds are normal. She exhibits no distension. There is no tenderness. There is no rebound.  Musculoskeletal: Normal range of motion.  Neurological: She is alert and oriented to person, place, and time. No cranial nerve deficit.  Skin: Skin is warm and dry.  Psychiatric: She has a normal mood and affect. Judgment normal.  Vitals reviewed.  Assessment: Annual Exam 1. Annual physical exam   2. Urge incontinence   3. Screening for cervical cancer   4. Screening for breast cancer   5. Screen for colon cancer    Plan:            1.  Cervical Screening-  Pap smear done today, Pap smear schedule reviewed with patient  2. Breast screening- Exam annually and mammogram scheduled  3. Colonoscopy every 10 years, Hemoccult testing after age 64  4. Labs managed by PCP  5. Counseling for hormonal therapy: none, no change in therapy today    F/U  Return in about 1 year (around 06/27/2018) for Annual.  Annamarie MajorPaul Amaziah Ghosh, MD, Merlinda FrederickFACOG [facility] 06/27/2017  3:23 PM

## 2017-06-29 LAB — PAP IG (IMAGE GUIDED): PAP SMEAR COMMENT: 0

## 2017-07-03 LAB — SPECIMEN STATUS REPORT

## 2017-07-03 LAB — FECAL OCCULT BLOOD, IMMUNOCHEMICAL: Fecal Occult Bld: NEGATIVE

## 2017-07-24 ENCOUNTER — Ambulatory Visit
Admission: RE | Admit: 2017-07-24 | Discharge: 2017-07-24 | Disposition: A | Discharge status: Home / Self Care | Payer: BC Managed Care – PPO | Source: Ambulatory Visit | Attending: Obstetrics & Gynecology | Admitting: Obstetrics & Gynecology

## 2017-07-24 DIAGNOSIS — Z1231 Encounter for screening mammogram for malignant neoplasm of breast: Secondary | ICD-10-CM | POA: Insufficient documentation

## 2017-07-24 DIAGNOSIS — Z1239 Encounter for other screening for malignant neoplasm of breast: Secondary | ICD-10-CM

## 2017-10-24 ENCOUNTER — Other Ambulatory Visit: Payer: Self-pay | Admitting: Family Medicine

## 2017-10-24 MED ORDER — VALSARTAN-HYDROCHLOROTHIAZIDE 160-25 MG PO TABS: 1.0000 | ORAL_TABLET | Freq: Every day | ORAL | 0 refills | Status: DC

## 2017-10-24 NOTE — Telephone Encounter (Signed)
Please advise patient that we sent one refil for her bp medication to mail-order, but she is overdue for follow up and needs to schedule o.v. Or CPE in the next month.

## 2017-11-06 ENCOUNTER — Telehealth: Payer: Self-pay

## 2017-11-06 MED ORDER — TELMISARTAN-HCTZ 40-12.5 MG PO TABS: 1.0000 | ORAL_TABLET | Freq: Every day | ORAL | 3 refills | Status: DC

## 2017-11-06 NOTE — Telephone Encounter (Signed)
Advised patient as below. Medication was sent into the pharmacy. Valsartan was discontinued.

## 2017-11-06 NOTE — Telephone Encounter (Signed)
Can change to telmisartan-hctz 40-12.5 one tablet daily, can 30 to local pharmacy and 90 to mail order if she likes.

## 2017-11-06 NOTE — Telephone Encounter (Signed)
Patient is currently taking Valsartan. Please advise. Thanks!

## 2017-11-06 NOTE — Telephone Encounter (Signed)
Patient reports she has not been able to receive her blood pressure medication. Patient reports that her mail order pharmacy sent out a letter for her to contact PCP for possible change in medication. sd  CB# [phone] [facility]

## 2018-03-19 ENCOUNTER — Encounter: Payer: Self-pay | Admitting: Family Medicine

## 2018-04-05 NOTE — Progress Notes (Signed)
Patient: Beverly PattenDoris A Portner, Female    DOB: [birth year], 67 y.o.   MRN: [id number] Visit Date: 04/09/2018  Today's Provider: Mila Merryonald Fisher, MD   Chief Complaint  Patient presents with  . Annual Exam  . Hypertension  . Hyperlipidemia   Subjective:    Annual physical exam Beverly Bush is a 67 y.o. female who presents today for health maintenance and complete physical. She feels well. She reports exercising some. She reports she is sleeping poorly.  ---------------------------------------------------------------   Hypertension, follow-up:  BP Readings from Last 3 Encounters:  04/09/18 125/74  06/27/17 120/80  06/28/16 110/70    She was last seen for hypertension 06/28/2016.  BP at that visit was 110/70. Management since that visit includes; no changes. Changes since last ov. Changed to Telmisartan-HCTZ 40-12.5 mg qd. Discontinued Valsratn.She reports good compliance with treatment. She is not having side effects. none She is not exercising. She is not adherent to low salt diet.   Outside blood pressures are not checking. She is experiencing none.  Patient denies none.   Cardiovascular risk factors include none.  Use of agents associated with hypertension: none.   ---------------------------------------------------------------    Lipid/Cholesterol, Follow-up:   Last seen for this 06/28/2016.  Management since that visit includes; labs checked, no changes.  Last Lipid Panel:    Component Value Date/Time   CHOL 211 (H) 06/29/2016 0837   TRIG 62 06/29/2016 0837   HDL 74 06/29/2016 0837   CHOLHDL 2.9 06/29/2016 0837   LDLCALC 125 (H) 06/29/2016 0837    She reports good compliance with treatment. She is not having side effects. none  Wt Readings from Last 3 Encounters:  04/09/18 181 lb (82.1 kg)  06/27/17 177 lb (80.3 kg)  06/28/16 180 lb (81.6 kg)    ---------------------------------------------------------------   Vitamin D deficiency From  06/28/2016-labs checked, no changes.   Pre-diabetes From 06/28/2016-labs checked, no changes. Hemoglobin A1c 6.0.  OSA From 09/09/2016-Sleep study done-showing severe sleep apnea. Had CPAP titration and started on 14cm pressure.  Which seem to help for awhile, but she hasn't used for about a month because it didn't seem to help anymore  She also complains she frequently gets up in the morning and her right shoulder and leg are sore and stiff and hurt to move to limits of ROM.    Review of Systems  Constitutional: Positive for fatigue.  Respiratory: Positive for apnea.   Cardiovascular: Positive for palpitations.  Genitourinary: Positive for frequency.  Musculoskeletal: Positive for arthralgias.  All other systems reviewed and are negative.   Social History      She  reports that she has never smoked. She has never used smokeless tobacco. She reports that she does not drink alcohol or use drugs.       Social History   Socioeconomic History  . Marital status: Married    Spouse name: Not on file  . Number of children: 2  . Years of education: College Gr  . Highest education level: Not on file  Occupational History  . Not on file  Social Needs  . Financial resource strain: Not on file  . Food insecurity:    Worry: Not on file    Inability: Not on file  . Transportation needs:    Medical: Not on file    Non-medical: Not on file  Tobacco Use  . Smoking status: Never Smoker  . Smokeless tobacco: Never Used  Substance and Sexual Activity  . Alcohol  use: No  . Drug use: No  . Sexual activity: Not Currently  Lifestyle  . Physical activity:    Days per week: Not on file    Minutes per session: Not on file  . Stress: Not on file  Relationships  . Social connections:    Talks on phone: Not on file    Gets together: Not on file    Attends religious service: Not on file    Active member of club or organization: Not on file    Attends meetings of clubs or organizations: Not  on file    Relationship status: Not on file  Other Topics Concern  . Not on file  Social History Narrative  . Not on file    History reviewed. No pertinent past medical history.   Patient Active Problem List   Diagnosis Date Noted  . OSA (obstructive sleep apnea) 09/16/2016  . Palpitations 05/27/2015  . Allergic rhinitis 05/26/2015  . Family history of diabetes mellitus 05/26/2015  . Ectopic atrial beats 05/26/2015  . Pre-diabetes 05/26/2015  . Pure hypercholesterolemia 05/26/2015  . Vitamin D deficiency 05/26/2015  . Depressive disorder 04/24/2009  . Urge incontinence 04/22/2008  . Postprocedural state 01/30/2007  . Hypertension, essential, benign 09/14/2005    Past Surgical History:  Procedure Laterality Date  . ABDOMINAL HYSTERECTOMY  2008   Due to heavy bleeding. Complete; does not have ovaries per patient    Family History        Family Status  Relation Name Status  . Mother  Alive  . Father  Alive  . Sister 1 Alive  . Sister 2 Alive  . Emelda BrothersPat Aunt  Deceased        Her family history includes Breast cancer in her paternal aunt, sister, and sister; Cancer in her sister and sister; Diabetes in her mother; Hypertension in her father.      No Known Allergies   Current Outpatient Medications:  .  Ergocalciferol (VITAMIN D2) 2000 UNITS TABS, Take 1 tablet by mouth daily., Disp: , Rfl:  .  telmisartan-hydrochlorothiazide (MICARDIS HCT) 40-12.5 MG tablet, Take 1 tablet by mouth daily., Disp: 90 tablet, Rfl: 3 .  fluticasone (FLONASE) 50 MCG/ACT nasal spray, Place 2 sprays into both nostrils daily. (Patient not taking: Reported on 04/09/2018), Disp: 16 g, Rfl: 1   Patient Care Team: Malva LimesFisher, Donald E, MD as PCP - [facility] (Family Medicine) [facility]      Objective:   Vitals: BP 125/74 (BP Location: Right Arm, Patient Position: Sitting, Cuff Size: Large)   Pulse 80   Temp 97.9 F (36.6 C) (Oral)   Resp 16   Ht 5\' 2"  (1.575 m)   Wt 181 lb (82.1  kg)   SpO2 96%   BMI 33.11 kg/m    Vitals:   04/09/18 1412  BP: 125/74  Pulse: 80  Resp: 16  Temp: 97.9 F (36.6 C)  TempSrc: Oral  SpO2: 96%  Weight: 181 lb (82.1 kg)  Height: 5\' 2"  (1.575 m)     Physical Exam   General Appearance:    Alert, cooperative, no distress, appears stated age  Head:    Normocephalic, without obvious abnormality, atraumatic  Eyes:    PERRL, conjunctiva/corneas clear, EOM's intact, fundi    benign, both eyes  Ears:    Normal TM's and external ear canals, both ears  Nose:   Nares normal, septum midline, mucosa pale and congested, no drainage    or sinus tenderness  Throat:  Lips, mucosa, and tongue normal; teeth and gums normal  Neck:   Supple, symmetrical, trachea midline, no adenopathy;    thyroid:  no enlargement/tenderness/nodules; no carotid   bruit or JVD  Back:     Symmetric, no curvature, ROM normal, no CVA tenderness  Lungs:     Clear to auscultation bilaterally, respirations unlabored  Chest Wall:    No tenderness or deformity   Heart:    Regular rate and rhythm, S1 and S2 normal, no murmur, rub   or gallop  Breast Exam:    deferred  Abdomen:     Soft, non-tender, bowel sounds active all four quadrants,    no masses, no organomegaly  Pelvic:    deferred  Extremities:   Extremities normal, atraumatic, no cyanosis or edema  Pulses:   2+ and symmetric all extremities  Skin:   Skin color, texture, turgor normal, no rashes or lesions  Lymph nodes:   Cervical, supraclavicular, and axillary nodes normal  Neurologic:   CNII-XII intact, normal strength, sensation and reflexes    throughout    Depression Screen PHQ 2/9 Scores 04/09/2018 06/28/2016  PHQ - 2 Score 0 1  PHQ- 9 Score 2 3      Assessment & Plan:     Routine Health Maintenance and Physical Exam  Exercise Activities and Dietary recommendations Goals   None     Immunization History  Administered Date(s) Administered  . Influenza,inj,Quad PF,6+ Mos 05/27/2015,  06/28/2016  . Tdap 04/22/2008  . Zoster 09/23/2014    Health Maintenance  Topic Date Due  . HIV Screening  06/07/1966  . COLONOSCOPY  01/25/2018  . INFLUENZA VACCINE  03/22/2018  . TETANUS/TDAP  04/22/2018  . MAMMOGRAM  07/25/2019  . Hepatitis C Screening  Completed     Discussed health benefits of physical activity, and encouraged her to engage in regular exercise appropriate for her age and condition.    --------------------------------------------------------------------  1. Annual physical exam Generally doing well. Followed at [facility] for pap.pelvic/breast exams. History of colon polyp per Dr. Markham Jordan and is due for follow up colonoscopy this year.   2. Hypertension, essential, benign Well controlled.  Continue current medications.   - Td vaccine greater than or equal to 7yo preservative free IM - EKG 12-Lead  3. Pure hypercholesterolemia She is tolerating lovastatin well with no adverse effects.   - Comprehensive metabolic panel - CBC - Lipid panel  4. Pre-diabetes  - Hemoglobin A1c  5. Vitamin D deficiency  - VITAMIN D 25 Hydroxy (Vit-D Deficiency, Fractures)  6. Need for prophylactic vaccination using tetanus and diphtheria toxoids adsorbed (Td) vaccine Td  7. Need for shingles vaccine Shingrix #1 today.   8. OSA (obstructive sleep apnea) Has not been using CPAP because she is not sure it is still helping. Advised to start using consistently for the next month. If not feeling considerable better than call to have repeat CPAP titration study which could probably be done at her home.   9. Strain of right shoulder, sequela Recommend OTC naprosyn   10. Allergic rhinitis due to pollen, unspecified seasonality Start back on prescription fluticasone nasal spray.    Mila Merry, MD  [facility]

## 2018-04-09 ENCOUNTER — Encounter: Payer: Self-pay | Admitting: Family Medicine

## 2018-04-09 ENCOUNTER — Ambulatory Visit (INDEPENDENT_AMBULATORY_CARE_PROVIDER_SITE_OTHER): Payer: BC Managed Care – PPO | Admitting: Family Medicine

## 2018-04-09 VITALS — BP 125/74 | HR 80 | Temp 97.9°F | Resp 16 | Ht 62.0 in | Wt 181.0 lb

## 2018-04-09 DIAGNOSIS — G4733 Obstructive sleep apnea (adult) (pediatric): Secondary | ICD-10-CM

## 2018-04-09 DIAGNOSIS — I10 Essential (primary) hypertension: Secondary | ICD-10-CM

## 2018-04-09 DIAGNOSIS — S46911S Strain of unspecified muscle, fascia and tendon at shoulder and upper arm level, right arm, sequela: Secondary | ICD-10-CM

## 2018-04-09 DIAGNOSIS — Z23 Encounter for immunization: Secondary | ICD-10-CM | POA: Diagnosis not present

## 2018-04-09 DIAGNOSIS — Z0000 Encounter for general adult medical examination without abnormal findings: Secondary | ICD-10-CM

## 2018-04-09 DIAGNOSIS — J301 Allergic rhinitis due to pollen: Secondary | ICD-10-CM

## 2018-04-09 DIAGNOSIS — R7303 Prediabetes: Secondary | ICD-10-CM

## 2018-04-09 DIAGNOSIS — E7800 Pure hypercholesterolemia, unspecified: Secondary | ICD-10-CM

## 2018-04-09 DIAGNOSIS — E559 Vitamin D deficiency, unspecified: Secondary | ICD-10-CM

## 2018-04-09 MED ORDER — FLUTICASONE PROPIONATE 50 MCG/ACT NA SUSP: 2.0000 | Freq: Every day | NASAL | 3 refills | Status: DC

## 2018-04-09 NOTE — Patient Instructions (Signed)
   Try taking 2 Aleve (220mg  naproxen) every night before bed

## 2018-04-13 LAB — CBC
HEMATOCRIT: 36.2 % (ref 34.0–46.6)
Hemoglobin: 12 g/dL (ref 11.1–15.9)
MCH: 29.8 pg (ref 26.6–33.0)
MCHC: 33.1 g/dL (ref 31.5–35.7)
MCV: 90 fL (ref 79–97)
PLATELETS: 208 10*3/uL (ref 150–450)
RBC: 4.03 x10E6/uL (ref 3.77–5.28)
RDW: 13.6 % (ref 12.3–15.4)
WBC: 5.4 10*3/uL (ref 3.4–10.8)

## 2018-04-13 LAB — COMPREHENSIVE METABOLIC PANEL
ALK PHOS: 63 IU/L (ref 39–117)
ALT: 24 IU/L (ref 0–32)
AST: 22 IU/L (ref 0–40)
Albumin/Globulin Ratio: 1.5 (ref 1.2–2.2)
Albumin: 4 g/dL (ref 3.6–4.8)
BUN/Creatinine Ratio: 19 (ref 12–28)
BUN: 17 mg/dL (ref 8–27)
Bilirubin Total: 0.4 mg/dL (ref 0.0–1.2)
CO2: 26 mmol/L (ref 20–29)
CREATININE: 0.9 mg/dL (ref 0.57–1.00)
Calcium: 9.6 mg/dL (ref 8.7–10.3)
Chloride: 104 mmol/L (ref 96–106)
GFR calc Af Amer: 78 mL/min/{1.73_m2} (ref >59)
GFR calc non Af Amer: 68 mL/min/{1.73_m2} (ref >59)
GLUCOSE: 125 mg/dL — AB (ref 65–99)
Globulin, Total: 2.7 g/dL (ref 1.5–4.5)
Potassium: 4.5 mmol/L (ref 3.5–5.2)
SODIUM: 143 mmol/L (ref 134–144)
Total Protein: 6.7 g/dL (ref 6.0–8.5)

## 2018-04-13 LAB — LIPID PANEL
Chol/HDL Ratio: 2.8 ratio (ref 0.0–4.4)
Cholesterol, Total: 212 mg/dL — ABNORMAL HIGH (ref 100–199)
HDL: 77 mg/dL (ref >39)
LDL Calculated: 123 mg/dL — ABNORMAL HIGH (ref 0–99)
Triglycerides: 61 mg/dL (ref 0–149)
VLDL CHOLESTEROL CAL: 12 mg/dL (ref 5–40)

## 2018-04-13 LAB — VITAMIN D 25 HYDROXY (VIT D DEFICIENCY, FRACTURES): Vit D, 25-Hydroxy: 38.6 ng/mL (ref 30.0–100.0)

## 2018-04-13 LAB — HEMOGLOBIN A1C
Est. average glucose Bld gHb Est-mCnc: 126 mg/dL
Hgb A1c MFr Bld: 6 % — ABNORMAL HIGH (ref 4.8–5.6)

## 2018-04-30 IMAGING — MG DIGITAL SCREENING BILATERAL MAMMOGRAM WITH CAD
6 series · 6 of 6 positions shown · non-contrast
Comparison: Previous exam(s).

CLINICAL DATA: Screening.

EXAM:
DIGITAL SCREENING BILATERAL MAMMOGRAM WITH CAD

[R CC]
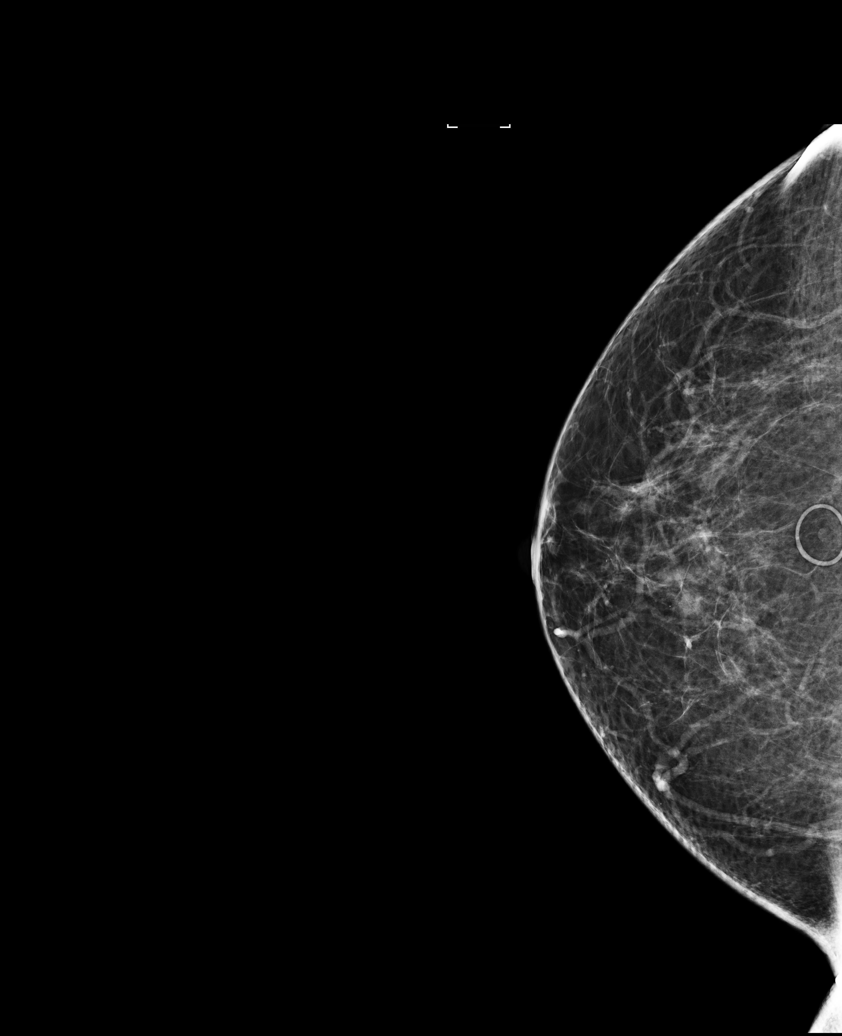

[R MLO (1 of 2)]
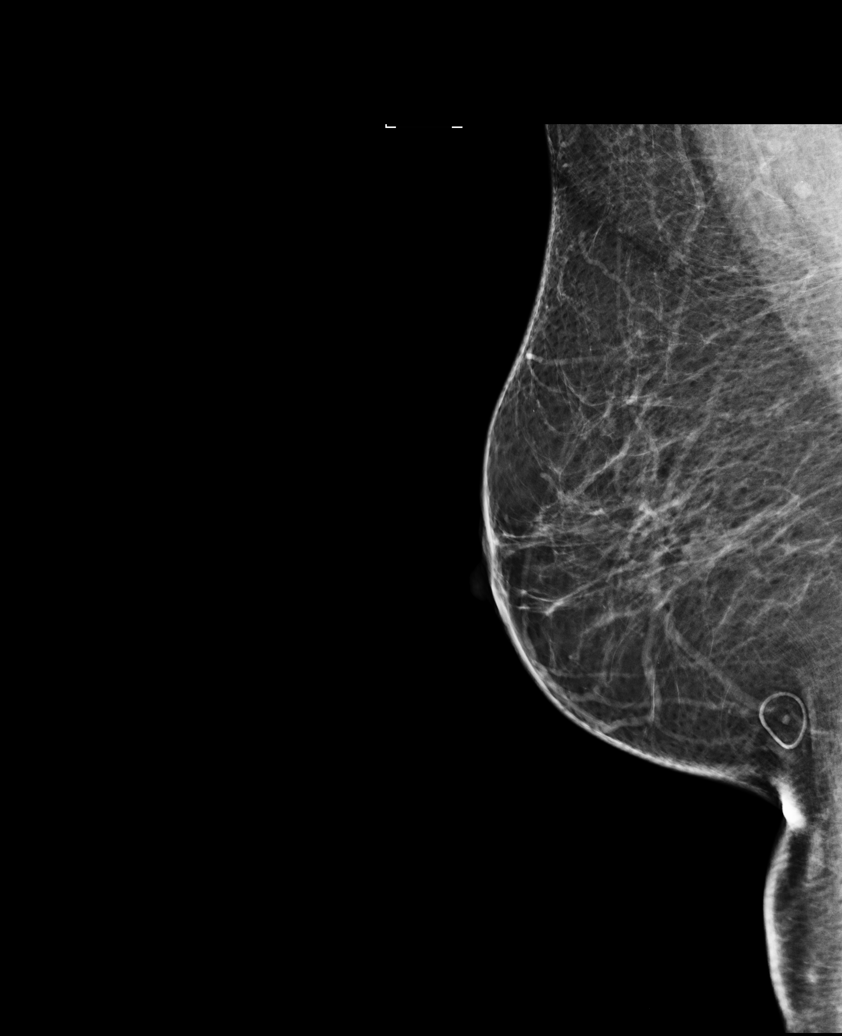

[R MLO (2 of 2)]
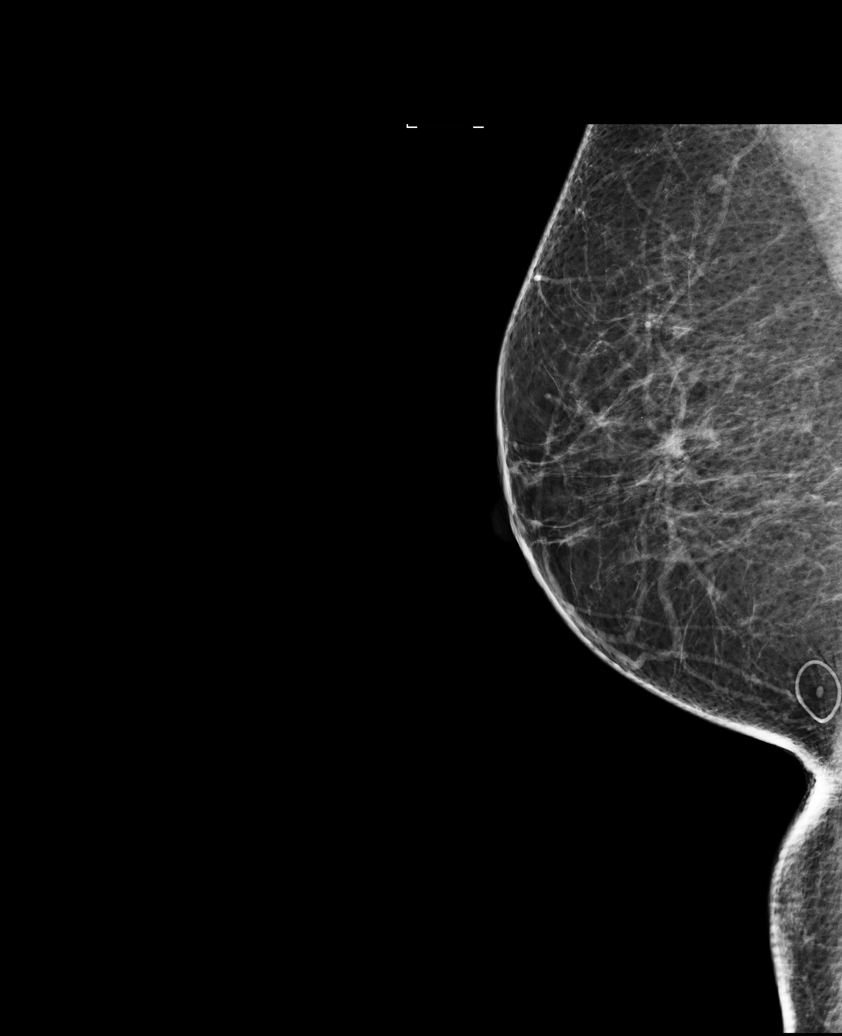

[L XCCL]
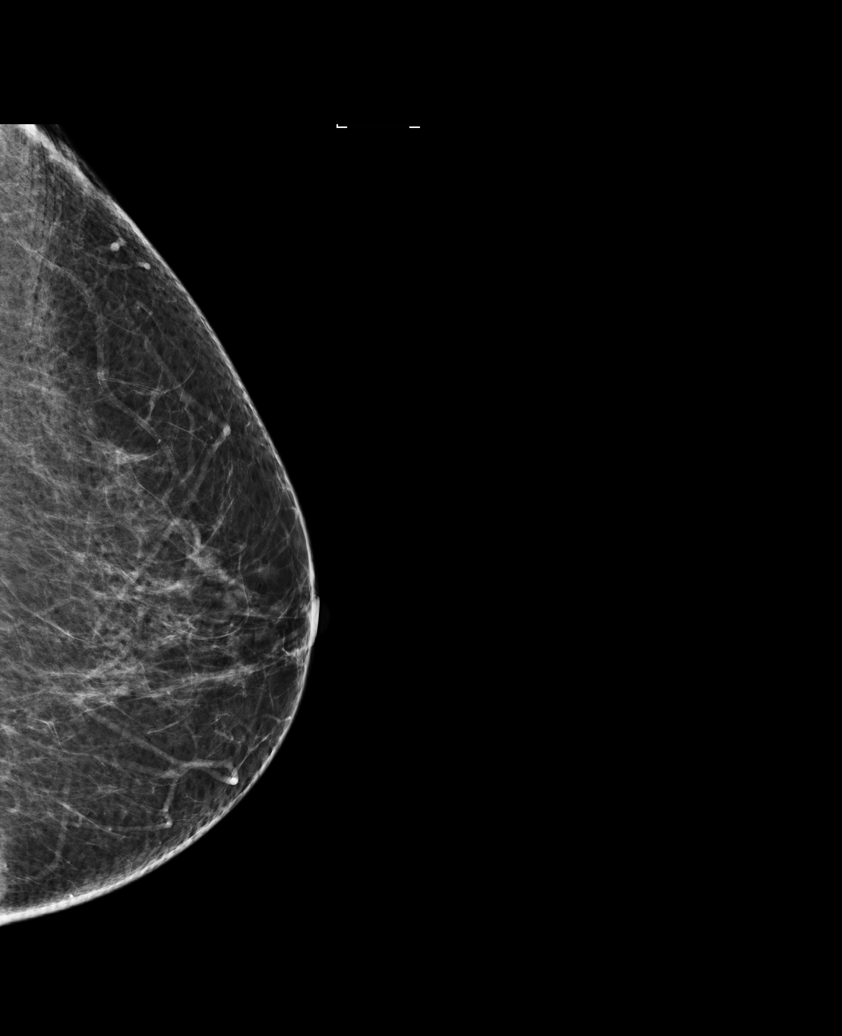

[L MLO]
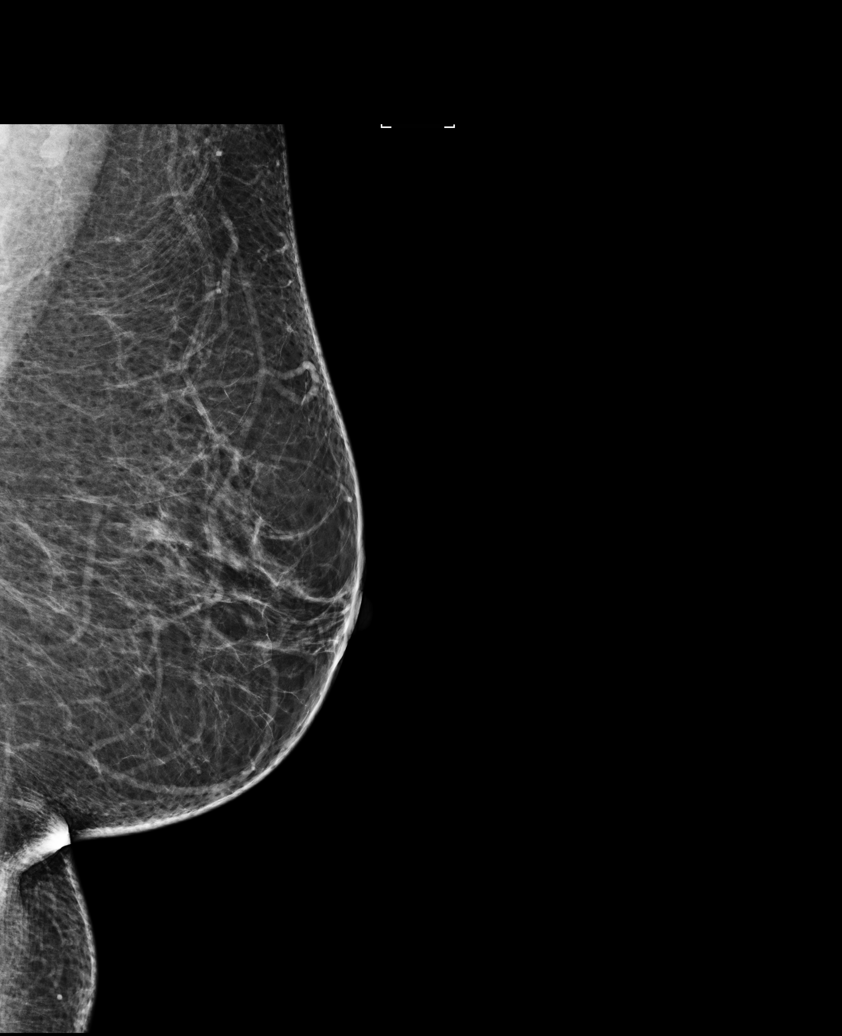

[L CC]
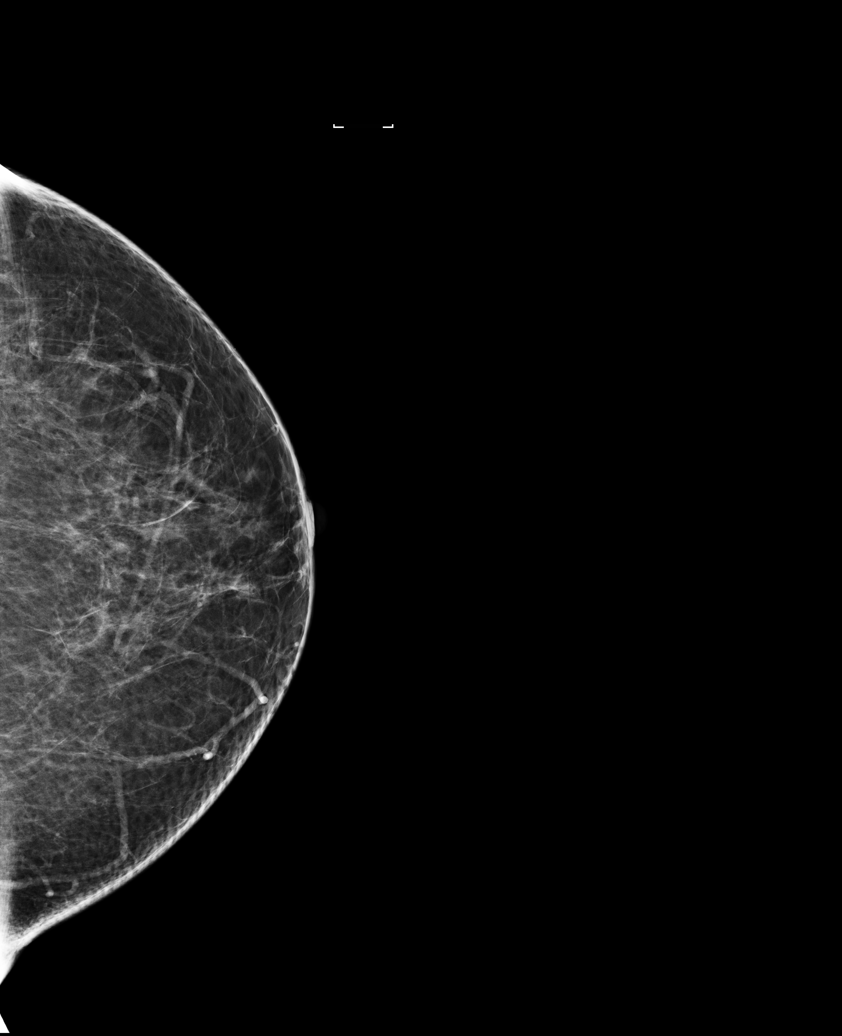

[6 of 6 positions shown; findings below may reference images not displayed]

ACR Breast Density Category b: There are scattered areas of
fibroglandular density.
FINDINGS: There are no findings suspicious for malignancy. Images were
processed with CAD.
IMPRESSION: No mammographic evidence of malignancy. A result letter of this
screening mammogram will be mailed directly to the patient.

RECOMMENDATION:
Screening mammogram in one year. (Code:AS-G-LCT)

BI-RADS CATEGORY  1: Negative.

## 2018-05-04 ENCOUNTER — Encounter: Payer: Self-pay | Admitting: *Deleted

## 2018-05-07 ENCOUNTER — Ambulatory Visit
Admission: RE | Admit: 2018-05-07 | Discharge: 2018-05-07 | Disposition: A | Discharge status: Home / Self Care | Payer: BC Managed Care – PPO | Source: Ambulatory Visit | Attending: Unknown Physician Specialty | Admitting: Unknown Physician Specialty

## 2018-05-07 ENCOUNTER — Encounter: Payer: Self-pay | Admitting: Anesthesiology

## 2018-05-07 ENCOUNTER — Ambulatory Visit: Payer: BC Managed Care – PPO | Admitting: Anesthesiology

## 2018-05-07 ENCOUNTER — Encounter
Admission: RE | Disposition: A | Discharge status: Home / Self Care | Payer: Self-pay | Source: Ambulatory Visit | Attending: Unknown Physician Specialty

## 2018-05-07 DIAGNOSIS — Z6832 Body mass index (BMI) 32.0-32.9, adult: Secondary | ICD-10-CM | POA: Insufficient documentation

## 2018-05-07 DIAGNOSIS — Z8371 Family history of colonic polyps: Secondary | ICD-10-CM | POA: Insufficient documentation

## 2018-05-07 DIAGNOSIS — E669 Obesity, unspecified: Secondary | ICD-10-CM | POA: Diagnosis not present

## 2018-05-07 DIAGNOSIS — G4730 Sleep apnea, unspecified: Secondary | ICD-10-CM | POA: Insufficient documentation

## 2018-05-07 DIAGNOSIS — F329 Major depressive disorder, single episode, unspecified: Secondary | ICD-10-CM | POA: Diagnosis not present

## 2018-05-07 DIAGNOSIS — K640 First degree hemorrhoids: Secondary | ICD-10-CM | POA: Diagnosis not present

## 2018-05-07 DIAGNOSIS — Z800 Family history of malignant neoplasm of digestive organs: Secondary | ICD-10-CM | POA: Diagnosis not present

## 2018-05-07 DIAGNOSIS — I10 Essential (primary) hypertension: Secondary | ICD-10-CM | POA: Insufficient documentation

## 2018-05-07 DIAGNOSIS — Z79899 Other long term (current) drug therapy: Secondary | ICD-10-CM | POA: Diagnosis not present

## 2018-05-07 DIAGNOSIS — Z1211 Encounter for screening for malignant neoplasm of colon: Secondary | ICD-10-CM | POA: Insufficient documentation

## 2018-05-07 DIAGNOSIS — Q858 Other phakomatoses, not elsewhere classified: Secondary | ICD-10-CM | POA: Diagnosis not present

## 2018-05-07 DIAGNOSIS — K621 Rectal polyp: Secondary | ICD-10-CM | POA: Insufficient documentation

## 2018-05-07 HISTORY — DX: Sleep apnea, unspecified: G47.30

## 2018-05-07 HISTORY — PX: COLONOSCOPY WITH PROPOFOL: SHX5780

## 2018-05-07 SURGERY — COLONOSCOPY WITH PROPOFOL
Anesthesia: General

## 2018-05-07 MED ORDER — PHENYLEPHRINE HCL 10 MG/ML IJ SOLN
INTRAMUSCULAR | Status: DC | PRN
  Administered 2018-05-07 (×2): 100 ug via INTRAVENOUS

## 2018-05-07 MED ORDER — MIDAZOLAM HCL 2 MG/2ML IJ SOLN
INTRAMUSCULAR | Status: AC
  Filled 2018-05-07: qty 2

## 2018-05-07 MED ORDER — LIDOCAINE HCL (PF) 2 % IJ SOLN
INTRAMUSCULAR | Status: DC | PRN
  Administered 2018-05-07: 80 mg

## 2018-05-07 MED ORDER — PROPOFOL 500 MG/50ML IV EMUL
INTRAVENOUS | Status: DC | PRN
  Administered 2018-05-07: 50 ug/kg/min via INTRAVENOUS

## 2018-05-07 MED ORDER — LIDOCAINE HCL (PF) 2 % IJ SOLN
INTRAMUSCULAR | Status: AC
  Filled 2018-05-07: qty 10

## 2018-05-07 MED ORDER — SODIUM CHLORIDE 0.9 % IV SOLN: INTRAVENOUS | Status: DC

## 2018-05-07 MED ORDER — FENTANYL CITRATE (PF) 100 MCG/2ML IJ SOLN
INTRAMUSCULAR | Status: DC | PRN
  Administered 2018-05-07: 25 ug via INTRAVENOUS

## 2018-05-07 MED ORDER — SODIUM CHLORIDE 0.9 % IV SOLN
INTRAVENOUS | Status: DC
  Administered 2018-05-07: 1000 mL via INTRAVENOUS

## 2018-05-07 MED ORDER — PROPOFOL 10 MG/ML IV BOLUS
INTRAVENOUS | Status: DC | PRN
  Administered 2018-05-07: 30 mg via INTRAVENOUS

## 2018-05-07 MED ORDER — GLYCOPYRROLATE 0.2 MG/ML IJ SOLN
INTRAMUSCULAR | Status: DC | PRN
  Administered 2018-05-07: 0.2 mg via INTRAVENOUS

## 2018-05-07 MED ORDER — GLYCOPYRROLATE 0.2 MG/ML IJ SOLN
INTRAMUSCULAR | Status: AC
  Filled 2018-05-07: qty 1

## 2018-05-07 MED ORDER — EPHEDRINE SULFATE 50 MG/ML IJ SOLN
INTRAMUSCULAR | Status: DC | PRN
  Administered 2018-05-07: 10 mg via INTRAVENOUS

## 2018-05-07 MED ORDER — FENTANYL CITRATE (PF) 100 MCG/2ML IJ SOLN
INTRAMUSCULAR | Status: AC
  Filled 2018-05-07: qty 2

## 2018-05-07 MED ORDER — MIDAZOLAM HCL 5 MG/5ML IJ SOLN
INTRAMUSCULAR | Status: DC | PRN
  Administered 2018-05-07: 2 mg via INTRAVENOUS

## 2018-05-07 NOTE — Anesthesia Postprocedure Evaluation (Signed)
Anesthesia Post Note  Patient: Beverly Bush  Procedure(s) Performed: COLONOSCOPY WITH PROPOFOL (N/A )  Patient location during evaluation: Endoscopy Anesthesia Type: General Level of consciousness: awake and alert Pain management: pain level controlled Vital Signs Assessment: post-procedure vital signs reviewed and stable Respiratory status: spontaneous breathing, nonlabored ventilation, respiratory function stable and patient connected to nasal cannula oxygen Cardiovascular status: blood pressure returned to baseline and stable Postop Assessment: no apparent nausea or vomiting Anesthetic complications: no     Last Vitals:  Vitals:   05/07/18 0923 05/07/18 0933  BP: 125/78 124/71  Pulse:    Resp:    Temp:    SpO2:      Last Pain:  Vitals:   05/07/18 0933  TempSrc:   PainSc: 0-No pain                 Osaze Hubbert S

## 2018-05-07 NOTE — H&P (Signed)
Primary Care Physician:  Malva Limes, MD Primary Gastroenterologist:  Dr. Mechele Collin  Pre-Procedure History & Physical: HPI:  Beverly Bush is a 67 y.o. female is here for an colonoscopy.FH of colon polyps and colon cancer.   Past Medical History:  Diagnosis Date  . Sleep apnea     Past Surgical History:  Procedure Laterality Date  . ABDOMINAL HYSTERECTOMY  2008   Due to heavy bleeding. Complete; does not have ovaries per patient    Prior to Admission medications   Medication Sig Start Date End Date Taking? Authorizing Provider  Cholecalciferol 2000 units CAPS Take 1 capsule by mouth.   Yes [provider]  Ergocalciferol (VITAMIN D2) 2000 UNITS TABS Take 1 tablet by mouth daily.   Yes [provider]  fluticasone (FLONASE) 50 MCG/ACT nasal spray Place 2 sprays into both nostrils daily. 04/09/18  Yes Malva Limes, MD  telmisartan-hydrochlorothiazide (MICARDIS HCT) 40-12.5 MG tablet Take 1 tablet by mouth daily. 11/06/17  Yes Malva Limes, MD    Allergies as of 03/09/2018  . (No Known Allergies)    Family History  Problem Relation Age of Onset  . Diabetes Mother        non insulin dependant diabetes mellitus  . Hypertension Father   . Cancer Sister        breast  . Breast cancer Sister   . Cancer Sister        colon cancer  . Breast cancer Sister   . Breast cancer Paternal Aunt     Social History   Socioeconomic History  . Marital status: Married    Spouse name: Not on file  . Number of children: 2  . Years of education: College Gr  . Highest education level: Not on file  Occupational History  . Not on file  Social Needs  . Financial resource strain: Not on file  . Food insecurity:    Worry: Not on file    Inability: Not on file  . Transportation needs:    Medical: Not on file    Non-medical: Not on file  Tobacco Use  . Smoking status: Never Smoker  . Smokeless tobacco: Never Used  Substance and Sexual Activity  . Alcohol  use: No  . Drug use: No  . Sexual activity: Not Currently  Lifestyle  . Physical activity:    Days per week: Not on file    Minutes per session: Not on file  . Stress: Not on file  Relationships  . Social connections:    Talks on phone: Not on file    Gets together: Not on file    Attends religious service: Not on file    Active member of club or organization: Not on file    Attends meetings of clubs or organizations: Not on file    Relationship status: Not on file  . Intimate partner violence:    Fear of current or ex partner: Not on file    Emotionally abused: Not on file    Physically abused: Not on file    Forced sexual activity: Not on file  Other Topics Concern  . Not on file  Social History Narrative  . Not on file    Review of Systems: See HPI, otherwise negative ROS  Physical Exam: BP 139/86   Pulse 71   Temp (!) 97.2 F (36.2 C) (Tympanic)   Resp 20   Ht 5\' 2"  (1.575 m)   Wt 80.7 kg  SpO2 100%   BMI 32.56 kg/m  General:   Alert,  pleasant and cooperative in NAD Head:  Normocephalic and atraumatic. Neck:  Supple; no masses or thyromegaly. Lungs:  Clear throughout to auscultation.    Heart:  Regular rate and rhythm. Abdomen:  Soft, nontender and nondistended. Normal bowel sounds, without guarding, and without rebound.   Neurologic:  Alert and  oriented x4;  grossly normal neurologically.  Impression/Plan: Beverly Bush is here for an colonoscopy to be performed for family history colon cancer and colon polyps.  Risks, benefits, limitations, and alternatives regarding  colonoscopy have been reviewed with the patient.  Questions have been answered.  All parties agreeable.   Lynnae PrudeELLIOTT, ROBERT, MD  05/07/2018, 8:35 AM

## 2018-05-07 NOTE — Anesthesia Preprocedure Evaluation (Signed)
Anesthesia Evaluation  Patient identified by MRN, date of birth, ID band Patient awake    Reviewed: Allergy & Precautions, NPO status , Patient's Chart, lab work & pertinent test results, reviewed documented beta blocker date and time   Airway Mallampati: III  TM Distance: >3 FB     Dental  (+) Chipped   Pulmonary sleep apnea ,           Cardiovascular hypertension,      Neuro/Psych PSYCHIATRIC DISORDERS Depression    GI/Hepatic   Endo/Other    Renal/GU      Musculoskeletal   Abdominal   Peds  Hematology   Anesthesia Other Findings Obese.  Reproductive/Obstetrics                             Anesthesia Physical Anesthesia Plan  ASA: III  Anesthesia Plan: General   Post-op Pain Management:    Induction: Intravenous  PONV Risk Score and Plan:   Airway Management Planned:   Additional Equipment:   Intra-op Plan:   Post-operative Plan:   Informed Consent: I have reviewed the patients History and Physical, chart, labs and discussed the procedure including the risks, benefits and alternatives for the proposed anesthesia with the patient or authorized representative who has indicated his/her understanding and acceptance.     Plan Discussed with: CRNA  Anesthesia Plan Comments:         Anesthesia Quick Evaluation

## 2018-05-07 NOTE — Op Note (Signed)
[facility] Gastroenterology Patient Name: Beverly Bush Procedure Date: 05/07/2018 8:38 AM MRN: [id number] Account #: [id number] Date of Birth: [birth year] Admit Type: Outpatient Age: 67 Room: [facility] ENDO ROOM 3 Gender: Female Note Status: Finalized Procedure:            Colonoscopy Indications:          Screening for colorectal malignant neoplasm Providers:            Scot Jun, MD Referring MD:         Demetrios Isaacs. Sherrie Mustache, MD (Referring MD) Medicines:            Propofol per Anesthesia Complications:        No immediate complications. Procedure:            Pre-Anesthesia Assessment:                       - After reviewing the risks and benefits, the patient                        was deemed in satisfactory condition to undergo the                        procedure.                       After obtaining informed consent, the colonoscope was                        passed under direct vision. Throughout the procedure,                        the patient's blood pressure, pulse, and oxygen                        saturations were monitored continuously. The                        Colonoscope was introduced through the anus and                        advanced to the the cecum, identified by appendiceal                        orifice and ileocecal valve. The colonoscopy was                        performed without difficulty. The patient tolerated the                        procedure well. The quality of the bowel preparation                        was excellent. Findings:      A diminutive polyp was found in the descending colon. The polyp was       sessile. The polyp was removed with a jumbo cold forceps. Resection and       retrieval were complete.      Two sessile polyps were found in the rectum. The polyps were diminutive       in size. These polyps were removed with a jumbo cold forceps. Resection  and retrieval were complete.      The exam was  otherwise without abnormality.      Internal hemorrhoids were found during endoscopy. The hemorrhoids were       small and Grade I (internal hemorrhoids that do not prolapse). Impression:           - One diminutive polyp in the descending colon, removed                        with a jumbo cold forceps. Resected and retrieved.                       - Two diminutive polyps in the rectum, removed with a                        jumbo cold forceps. Resected and retrieved.                       - The examination was otherwise normal.                       - Internal hemorrhoids. Recommendation:       - Await pathology results. Scot Junobert T Elliott, MD 05/07/2018 9:02:46 AM This report has been signed electronically. Number of Addenda: 0 Note Initiated On: 05/07/2018 8:38 AM Scope Withdrawal Time: 0 hours 10 minutes 58 seconds  Total Procedure Duration: 0 hours 16 minutes 33 seconds       [facility]

## 2018-05-07 NOTE — Transfer of Care (Signed)
Immediate Anesthesia Transfer of Care Note  Patient: Beverly Bush  Procedure(s) Performed: COLONOSCOPY WITH PROPOFOL (N/A )  Patient Location: PACU  Anesthesia Type:General  Level of Consciousness: sedated  Airway & Oxygen Therapy: Patient Spontanous Breathing and Patient connected to nasal cannula oxygen  Post-op Assessment: Report given to RN and Post -op Vital signs reviewed and stable  Post vital signs: Reviewed and stable  Last Vitals:  Vitals Value Taken Time  BP 121/77 05/07/2018  9:03 AM  Temp 36.1 C 05/07/2018  9:03 AM  Pulse 89 05/07/2018  9:03 AM  Resp 13 05/07/2018  9:03 AM  SpO2 100 % 05/07/2018  9:03 AM    Last Pain:  Vitals:   05/07/18 0903  TempSrc: Tympanic  PainSc: Asleep         Complications: No apparent anesthesia complications

## 2018-05-07 NOTE — Anesthesia Post-op Follow-up Note (Signed)
Anesthesia QCDR form completed.        

## 2018-05-08 ENCOUNTER — Encounter: Payer: Self-pay | Admitting: Unknown Physician Specialty

## 2018-05-08 ENCOUNTER — Other Ambulatory Visit: Payer: Self-pay | Admitting: Family Medicine

## 2018-05-08 LAB — SURGICAL PATHOLOGY

## 2018-05-08 MED ORDER — TELMISARTAN-HCTZ 40-12.5 MG PO TABS: 1.0000 | ORAL_TABLET | Freq: Every day | ORAL | 3 refills | Status: DC

## 2018-05-08 NOTE — Telephone Encounter (Signed)
Pt needs a new refill on  Telmisartin 40-12.5  She uses [facility]  Pt' sCB#  [phone]  Thanks Barth Kirksteri

## 2018-05-08 NOTE — Telephone Encounter (Signed)
telmisartan-hydrochlorothiazide (MICARDIS HCT) 40-12.5 MG tablet

## 2018-06-13 ENCOUNTER — Ambulatory Visit (INDEPENDENT_AMBULATORY_CARE_PROVIDER_SITE_OTHER): Payer: BC Managed Care – PPO | Admitting: Family Medicine

## 2018-06-13 ENCOUNTER — Ambulatory Visit: Payer: BC Managed Care – PPO | Admitting: Family Medicine

## 2018-06-13 DIAGNOSIS — Z23 Encounter for immunization: Secondary | ICD-10-CM | POA: Diagnosis not present

## 2018-06-13 NOTE — Progress Notes (Signed)
Nurse visit only. Administered Shingrix #2 and Influenza vaccine.

## 2018-06-19 ENCOUNTER — Other Ambulatory Visit: Payer: Self-pay | Admitting: Family Medicine

## 2018-06-19 DIAGNOSIS — J301 Allergic rhinitis due to pollen: Secondary | ICD-10-CM

## 2018-06-29 ENCOUNTER — Other Ambulatory Visit: Payer: Self-pay | Admitting: Obstetrics & Gynecology

## 2018-06-29 DIAGNOSIS — Z1231 Encounter for screening mammogram for malignant neoplasm of breast: Secondary | ICD-10-CM

## 2018-07-16 ENCOUNTER — Ambulatory Visit (INDEPENDENT_AMBULATORY_CARE_PROVIDER_SITE_OTHER): Payer: BC Managed Care – PPO | Admitting: Obstetrics & Gynecology

## 2018-07-16 ENCOUNTER — Telehealth: Payer: Self-pay | Admitting: Obstetrics & Gynecology

## 2018-07-16 ENCOUNTER — Encounter: Payer: Self-pay | Admitting: Obstetrics & Gynecology

## 2018-07-16 VITALS — BP 130/78 | Ht 62.0 in | Wt 182.0 lb

## 2018-07-16 DIAGNOSIS — Z13820 Encounter for screening for osteoporosis: Secondary | ICD-10-CM

## 2018-07-16 DIAGNOSIS — Z0000 Encounter for general adult medical examination without abnormal findings: Secondary | ICD-10-CM

## 2018-07-16 DIAGNOSIS — Z01419 Encounter for gynecological examination (general) (routine) without abnormal findings: Secondary | ICD-10-CM

## 2018-07-16 NOTE — Progress Notes (Signed)
HPI:      Beverly Bush is a 67 y.o. G3P2 who LMP was in the past as has had prior hysterectomy, she presents today for her annual examination.  The patient has no complaints today. The patient is sexually active. Herlast pap: approximate date 2018 and was normal and last mammogram: approximate date 2018 and was normal.  The patient does perform self breast exams.  There is no notable family history of breast or ovarian cancer in her family. The patient is not taking hormone replacement therapy. Patient denies post-menopausal vaginal bleeding.   The patient has regular exercise: yes. The patient denies current symptoms of depression.    GYN Hx: Last Colonoscopy:2 months ago. Normal.  Last DEXA: not yet   PMHx: Past Medical History:  Diagnosis Date  . Sleep apnea    Past Surgical History:  Procedure Laterality Date  . ABDOMINAL HYSTERECTOMY  2008   Due to heavy bleeding. Complete; does not have ovaries per patient  . COLONOSCOPY WITH PROPOFOL N/A 05/07/2018   Procedure: COLONOSCOPY WITH PROPOFOL;  Surgeon: Scot Jun, MD;  Location: [facility] ENDOSCOPY;  Service: Endoscopy;  Laterality: N/A;   Family History  Problem Relation Age of Onset  . Diabetes Mother        non insulin dependant diabetes mellitus  . Hypertension Father   . Cancer Sister        breast  . Breast cancer Sister   . Cancer Sister        colon cancer  . Breast cancer Sister   . Breast cancer Paternal Aunt    Social History   Tobacco Use  . Smoking status: Never Smoker  . Smokeless tobacco: Never Used  Substance Use Topics  . Alcohol use: No  . Drug use: No    Current Outpatient Medications:  .  Cholecalciferol 2000 units CAPS, Take 1 capsule by mouth., Disp: , Rfl:  .  Ergocalciferol (VITAMIN D2) 2000 UNITS TABS, Take 1 tablet by mouth daily., Disp: , Rfl:  .  fluticasone (FLONASE) 50 MCG/ACT nasal spray, SPRAY 2 SPRAYS INTO EACH NOSTRIL EVERY DAY, Disp: 16 g, Rfl: 1 .   telmisartan-hydrochlorothiazide (MICARDIS HCT) 40-12.5 MG tablet, Take 1 tablet by mouth daily., Disp: 90 tablet, Rfl: 3 Allergies: Patient has no known allergies.  Review of Systems  Constitutional: Negative for chills, fever and malaise/fatigue.  HENT: Negative for congestion, sinus pain and sore throat.   Eyes: Negative for blurred vision and pain.  Respiratory: Negative for cough and wheezing.   Cardiovascular: Negative for chest pain and leg swelling.  Gastrointestinal: Negative for abdominal pain, constipation, diarrhea, heartburn, nausea and vomiting.  Genitourinary: Negative for dysuria, frequency, hematuria and urgency.  Musculoskeletal: Negative for back pain, joint pain, myalgias and neck pain.  Skin: Negative for itching and rash.  Neurological: Negative for dizziness, tremors and weakness.  Endo/Heme/Allergies: Does not bruise/bleed easily.  Psychiatric/Behavioral: Negative for depression. The patient is not nervous/anxious and does not have insomnia.     Objective: BP 130/78   Ht 5\' 2"  (1.575 m)   Wt 182 lb (82.6 kg)   BMI 33.29 kg/m   Filed Weights   07/16/18 0849  Weight: 182 lb (82.6 kg)   Body mass index is 33.29 kg/m. Physical Exam  Constitutional: She is oriented to person, place, and time. She appears well-developed and well-nourished. No distress.  Genitourinary: Rectum normal and vagina normal. Pelvic exam was performed with patient supine. There is no rash or lesion on  the right labia. There is no rash or lesion on the left labia. Vagina exhibits no lesion. No bleeding in the vagina. Right adnexum does not display mass and does not display tenderness. Left adnexum does not display mass and does not display tenderness.  Genitourinary Comments: Absent Uterus Absent cervix Vaginal cuff well healed  HENT:  Head: Normocephalic and atraumatic. Head is without laceration.  Right Ear: Hearing normal.  Left Ear: Hearing normal.  Nose: No epistaxis.  No foreign  bodies.  Mouth/Throat: Uvula is midline, oropharynx is clear and moist and mucous membranes are normal.  Eyes: Pupils are equal, round, and reactive to light.  Neck: Normal range of motion. Neck supple. No thyromegaly present.  Cardiovascular: Normal rate and regular rhythm. Exam reveals no gallop and no friction rub.  No murmur heard. Pulmonary/Chest: Effort normal and breath sounds normal. No respiratory distress. She has no wheezes. Right breast exhibits no mass, no skin change and no tenderness. Left breast exhibits no mass, no skin change and no tenderness.  Abdominal: Soft. Bowel sounds are normal. She exhibits no distension. There is no tenderness. There is no rebound.  Musculoskeletal: Normal range of motion.  1cm fleshy raised on a stalk condylomatous skin tag on inner right thigh 12 cm from groin  Neurological: She is alert and oriented to person, place, and time. No cranial nerve deficit.  Skin: Skin is warm and dry.  Psychiatric: She has a normal mood and affect. Judgment normal.  Vitals reviewed.  Assessment: Annual Exam 1. Annual physical exam   2. Screening for osteoporosis    Plan:            1.  Cervical Screening-  Pap smear schedule reviewed with patient  2. Breast screening- Exam annually and mammogram scheduled  3. Colonoscopy every 10 years, Hemoccult testing after age 65  4. Labs managed by PCP  5. Counseling for hormonal therapy: no change in therapy today  6. DEXA discussed, plan to schedule  7. Skin tag/ condyloma, getting larger, desires excision at some point, may consider dermatology as needs skin screening exam and they could remove (right inner thigh)    F/U  Return in about 1 year (around 07/17/2019) for Annual.  [name], MD, Merlinda FrederickFACOG [facility], [facility] 07/16/2018  9:12 AM

## 2018-07-16 NOTE — Telephone Encounter (Signed)
Patient is aware and will keep the 08/16/18 appointment.

## 2018-07-16 NOTE — Patient Instructions (Signed)
PAP every 3-5 years Mammogram every year, as scheduled Dec 5 Colonoscopy every 10 years, up to date 04/2018 Labs yearly (with PCP)

## 2018-07-16 NOTE — Telephone Encounter (Signed)
Patient is [facility] on Thursday, 08/16/18 @ 9:00am. If patient would like the mammogram and DEXA together, she would need to reschedule both for January. Lmtrc.

## 2018-07-26 ENCOUNTER — Ambulatory Visit
Admission: RE | Admit: 2018-07-26 | Discharge: 2018-07-26 | Disposition: A | Discharge status: Home / Self Care | Payer: BC Managed Care – PPO | Source: Ambulatory Visit | Attending: Obstetrics & Gynecology | Admitting: Obstetrics & Gynecology

## 2018-07-26 DIAGNOSIS — Z1231 Encounter for screening mammogram for malignant neoplasm of breast: Secondary | ICD-10-CM | POA: Insufficient documentation

## 2018-08-16 ENCOUNTER — Ambulatory Visit
Admission: RE | Admit: 2018-08-16 | Discharge: 2018-08-16 | Disposition: A | Discharge status: Home / Self Care | Payer: BC Managed Care – PPO | Source: Ambulatory Visit | Attending: Obstetrics & Gynecology | Admitting: Obstetrics & Gynecology

## 2018-08-16 DIAGNOSIS — Z13820 Encounter for screening for osteoporosis: Secondary | ICD-10-CM | POA: Insufficient documentation

## 2018-09-12 ENCOUNTER — Telehealth: Payer: Self-pay | Admitting: Obstetrics & Gynecology

## 2018-09-12 NOTE — Telephone Encounter (Signed)
Patient is calling for  results for her bone density. Please advise.

## 2019-04-24 ENCOUNTER — Other Ambulatory Visit: Payer: Self-pay | Admitting: Family Medicine

## 2019-04-30 ENCOUNTER — Other Ambulatory Visit: Payer: Self-pay

## 2019-04-30 DIAGNOSIS — Z20822 Contact with and (suspected) exposure to covid-19: Secondary | ICD-10-CM

## 2019-05-02 LAB — NOVEL CORONAVIRUS, NAA: SARS-CoV-2, NAA: NOT DETECTED

## 2019-07-23 ENCOUNTER — Other Ambulatory Visit: Payer: Self-pay

## 2019-07-23 ENCOUNTER — Encounter: Payer: Self-pay | Admitting: Obstetrics & Gynecology

## 2019-07-23 ENCOUNTER — Ambulatory Visit (INDEPENDENT_AMBULATORY_CARE_PROVIDER_SITE_OTHER): Payer: BC Managed Care – PPO | Admitting: Obstetrics & Gynecology

## 2019-07-23 VITALS — BP 140/90 | Ht 62.0 in | Wt 179.0 lb

## 2019-07-23 DIAGNOSIS — Z01419 Encounter for gynecological examination (general) (routine) without abnormal findings: Secondary | ICD-10-CM | POA: Diagnosis not present

## 2019-07-23 DIAGNOSIS — Z1239 Encounter for other screening for malignant neoplasm of breast: Secondary | ICD-10-CM

## 2019-07-23 DIAGNOSIS — Z23 Encounter for immunization: Secondary | ICD-10-CM

## 2019-07-23 NOTE — Patient Instructions (Signed)
PAP every 5 years Mammogram every year    Call [phone] to schedule at [facility] Colonoscopy every 5 years Labs yearly (with PCP)

## 2019-07-23 NOTE — Progress Notes (Signed)
HPI:      [name] is a 68 y.o. G3P2 who LMP was in the past, she presents today for her annual examination.  The patient has no complaints today. The patient is sexually active. Her last pap 2018: was normal and last mammogram: approximate date 2019 and was normal.  The patient does perform self breast exams.  There is no notable family history of breast or ovarian cancer in her family. The patient is not taking hormone replacement therapy. Patient denies post-menopausal vaginal bleeding.   The patient has regular exercise: yes. The patient denies current symptoms of depression.   Pt plans to retire next month!   GYN Hx: Last Colonoscopy:3 years ago. Normal.  Last DEXA: year ago.  Osteopenia.  Takes Ca and VitD  PMHx: Past Medical History:  Diagnosis Date  . Sleep apnea    Past Surgical History:  Procedure Laterality Date  . ABDOMINAL HYSTERECTOMY  2008   Due to heavy bleeding. Complete; does not have ovaries per patient  . COLONOSCOPY WITH PROPOFOL N/A 05/07/2018   Procedure: COLONOSCOPY WITH PROPOFOL;  Surgeon: Manya Silvas, MD;  Location: [facility] ENDOSCOPY;  Service: Endoscopy;  Laterality: N/A;   Family History  Problem Relation Age of Onset  . Diabetes Mother        non insulin dependant diabetes mellitus  . Hypertension Father   . Cancer Sister        breast  . Breast cancer Sister   . Cancer Sister        colon cancer  . Breast cancer Sister   . Breast cancer Paternal Aunt    Social History   Tobacco Use  . Smoking status: Never Smoker  . Smokeless tobacco: Never Used  Substance Use Topics  . Alcohol use: No  . Drug use: No    Current Outpatient Medications:  .  Cholecalciferol 2000 units CAPS, Take 1 capsule by mouth., Disp: , Rfl:  .  Ergocalciferol (VITAMIN D2) 2000 UNITS TABS, Take 1 tablet by mouth daily., Disp: , Rfl:  .  fluticasone (FLONASE) 50 MCG/ACT nasal spray, SPRAY 2 SPRAYS INTO EACH NOSTRIL EVERY DAY, Disp: 16 g, Rfl: 1 .   telmisartan-hydrochlorothiazide (MICARDIS HCT) 40-12.5 MG tablet, TAKE 1 TABLET DAILY, Disp: 90 tablet, Rfl: 3 Allergies: Patient has no known allergies.  Review of Systems  Constitutional: Negative for chills, fever and malaise/fatigue.  HENT: Negative for congestion, sinus pain and sore throat.   Eyes: Negative for blurred vision and pain.  Respiratory: Negative for cough and wheezing.   Cardiovascular: Negative for chest pain and leg swelling.  Gastrointestinal: Negative for abdominal pain, constipation, diarrhea, heartburn, nausea and vomiting.  Genitourinary: Negative for dysuria, frequency, hematuria and urgency.  Musculoskeletal: Negative for back pain, joint pain, myalgias and neck pain.  Skin: Negative for itching and rash.  Neurological: Negative for dizziness, tremors and weakness.  Endo/Heme/Allergies: Does not bruise/bleed easily.  Psychiatric/Behavioral: Negative for depression. The patient is not nervous/anxious and does not have insomnia.     Objective: BP 140/90   Ht 5\' 2"  (1.575 m)   Wt 179 lb (81.2 kg)   BMI 32.74 kg/m   Filed Weights   07/23/19 0906  Weight: 179 lb (81.2 kg)   Body mass index is 32.74 kg/m. Physical Exam Constitutional:      General: She is not in acute distress.    Appearance: She is well-developed.  Genitourinary:     Pelvic exam was performed with patient supine.  Vagina and rectum normal.     No lesions in the vagina.     No vaginal bleeding.     No right or left adnexal mass present.     Right adnexa not tender.     Left adnexa not tender.     Genitourinary Comments: Absent Uterus Absent cervix Vaginal cuff well healed  HENT:     Head: Normocephalic and atraumatic. No laceration.     Right Ear: Hearing normal.     Left Ear: Hearing normal.     Mouth/Throat:     Pharynx: Uvula midline.  Eyes:     Pupils: Pupils are equal, round, and reactive to light.  Neck:     Musculoskeletal: Normal range of motion and neck supple.      Thyroid: No thyromegaly.  Cardiovascular:     Rate and Rhythm: Normal rate and regular rhythm.     Heart sounds: No murmur. No friction rub. No gallop.   Pulmonary:     Effort: Pulmonary effort is normal. No respiratory distress.     Breath sounds: Normal breath sounds. No wheezing.  Chest:     Breasts:        Right: No mass, skin change or tenderness.        Left: No mass, skin change or tenderness.  Abdominal:     General: Bowel sounds are normal. There is no distension.     Palpations: Abdomen is soft.     Tenderness: There is no abdominal tenderness. There is no rebound.  Musculoskeletal: Normal range of motion.  Neurological:     Mental Status: She is alert and oriented to person, place, and time.     Cranial Nerves: No cranial nerve deficit.  Skin:    General: Skin is warm and dry.  Psychiatric:        Judgment: Judgment normal.  Vitals signs reviewed.     Assessment: Annual Exam 1. Women's annual routine gynecological examination   2. Encounter for screening for malignant neoplasm of breast, unspecified screening modality     Plan:            1.  Cervical Screening-  Pap smear schedule reviewed with patient  2. Breast screening- Exam annually and mammogram scheduled  3. Colonoscopy every 10 years, Hemoccult testing after age 8  4. Labs managed by PCP  5. Counseling for hormonal therapy: none              6. FRAX - FRAX score for assessing the 10 year probability for fracture calculated and discussed today. (3.6% 10 yr probability) Based on age and score today, DEXA is up to date 2019.   7. Flu shot today    F/U  Return in about 1 year (around 07/22/2020) for Annual.  Annamarie Major, MD, Mer[facility] Frederick Ob/Gyn, [facility] 07/23/2019  9:17 AM

## 2019-07-29 ENCOUNTER — Ambulatory Visit
Admission: RE | Admit: 2019-07-29 | Discharge: 2019-07-29 | Disposition: A | Discharge status: Home / Self Care | Payer: BC Managed Care – PPO | Source: Ambulatory Visit | Attending: Obstetrics & Gynecology | Admitting: Obstetrics & Gynecology

## 2019-07-29 DIAGNOSIS — Z1231 Encounter for screening mammogram for malignant neoplasm of breast: Secondary | ICD-10-CM | POA: Insufficient documentation

## 2019-07-29 DIAGNOSIS — Z1239 Encounter for other screening for malignant neoplasm of breast: Secondary | ICD-10-CM | POA: Diagnosis present

## 2019-08-28 ENCOUNTER — Encounter: Payer: Self-pay | Admitting: Family Medicine

## 2019-08-28 ENCOUNTER — Other Ambulatory Visit: Payer: Self-pay

## 2019-08-28 ENCOUNTER — Ambulatory Visit (INDEPENDENT_AMBULATORY_CARE_PROVIDER_SITE_OTHER): Payer: BC Managed Care – PPO | Admitting: Family Medicine

## 2019-08-28 VITALS — BP 128/66 | HR 78 | Temp 97.1°F | Resp 16 | Ht 62.0 in | Wt 178.0 lb

## 2019-08-28 DIAGNOSIS — E559 Vitamin D deficiency, unspecified: Secondary | ICD-10-CM | POA: Diagnosis not present

## 2019-08-28 DIAGNOSIS — I10 Essential (primary) hypertension: Secondary | ICD-10-CM

## 2019-08-28 DIAGNOSIS — R739 Hyperglycemia, unspecified: Secondary | ICD-10-CM

## 2019-08-28 DIAGNOSIS — Z23 Encounter for immunization: Secondary | ICD-10-CM

## 2019-08-28 DIAGNOSIS — Z0000 Encounter for general adult medical examination without abnormal findings: Secondary | ICD-10-CM

## 2019-08-28 DIAGNOSIS — M8589 Other specified disorders of bone density and structure, multiple sites: Secondary | ICD-10-CM

## 2019-08-28 DIAGNOSIS — R7303 Prediabetes: Secondary | ICD-10-CM

## 2019-08-28 NOTE — Patient Instructions (Signed)
.   Please review the attached list of medications and notify my office if there are any errors.   . Please bring all of your medications to every appointment so we can make sure that our medication list is the same as yours.   

## 2019-08-28 NOTE — Progress Notes (Signed)
Patient: Beverly Bush, Female    DOB: 30-Oct-1950, 69 y.o.   MRN: [id number] Visit Date: 08/28/2019  Today's Provider: Mila Merry, MD   Chief Complaint  Patient presents with  . Annual Exam  . Hypertension   Subjective:     Complete Physical Beverly Bush is a 69 y.o. female. She feels well. She reports no regular exercising. She reports she is sleeping fairly well. She is followed at [facility] by Dr. Tiburcio Pea who has her up to date on breast exams and BMD  -----------------------------------------------------------  Prediabetes, Follow-up:   Lab Results  Component Value Date   HGBA1C 6.0 (H) 04/12/2018   HGBA1C 6.0 (H) 06/29/2016   HGBA1C 5.9 05/27/2015   Last seen for diabetes 1 years ago.  Management since then includes no changes. She reports good compliance with treatment. She is not having side effects.  Current symptoms include none and have been stable.  Weight trend: stable Prior visit with dietician: no Current diet: in general, a "healthy" diet   Current exercise: none  ------------------------------------------------------------------------   Hypertension, follow-up:  BP Readings from Last 3 Encounters:  08/28/19 128/66  07/23/19 140/90  07/16/18 130/78    She was last seen for hypertension 1 years ago.  Management since that visit includes no changes.She reports good compliance with treatment. She is not having side effects.  She is not exercising. She is adherent to low salt diet.   Outside blood pressures are not checked. She is experiencing none.  Patient denies chest pain, chest pressure/discomfort, claudication, dyspnea, exertional chest pressure/discomfort, fatigue, irregular heart beat, lower extremity edema, near-syncope, orthopnea, palpitations, paroxysmal nocturnal dyspnea, syncope and tachypnea.   Cardiovascular risk factors include advanced age (older than 34 for men, 65 for women), dyslipidemia and hypertension.  Use  of agents associated with hypertension: none.   ------------------------------------------------------------------------    Lipid/Cholesterol, Follow-up:   Last seen for this 1 years ago.  Management since that visit includes none.  Last Lipid Panel:    Component Value Date/Time   CHOL 212 (H) 04/12/2018 0820   TRIG 61 04/12/2018 0820   HDL 77 04/12/2018 0820   CHOLHDL 2.8 04/12/2018 0820   LDLCALC 123 (H) 04/12/2018 0820    She reports good compliance with treatment. She is not having side effects.   Wt Readings from Last 3 Encounters:  08/28/19 178 lb (80.7 kg)  07/23/19 179 lb (81.2 kg)  07/16/18 182 lb (82.6 kg)    ------------------------------------------------------------------------  Follow up for Vitamin D Deficiency:  The patient was last seen for this 1 years ago. Changes made at last visit include none.  She reports good compliance with treatment. She feels that condition is Unchanged. She is not having side effects.      Review of Systems  Constitutional: Negative for chills, fatigue and fever.  HENT: Negative for congestion, ear pain, rhinorrhea, sneezing and sore throat.   Eyes: Negative.  Negative for pain and redness.  Respiratory: Negative for cough, shortness of breath and wheezing.   Cardiovascular: Negative for chest pain and leg swelling.  Gastrointestinal: Negative for abdominal pain, blood in stool, constipation, diarrhea and nausea.  Endocrine: Negative for polydipsia and polyphagia.  Genitourinary: Negative.  Negative for dysuria, flank pain, hematuria, pelvic pain, vaginal bleeding and vaginal discharge.  Musculoskeletal: Positive for back pain. Negative for arthralgias, gait problem and joint swelling.  Skin: Negative for rash.  Neurological: Negative.  Negative for dizziness, tremors, seizures, weakness, light-headedness, numbness and  headaches.  Hematological: Negative for adenopathy.  Psychiatric/Behavioral: Negative.  Negative  for behavioral problems, confusion and dysphoric mood. The patient is not nervous/anxious and is not hyperactive.     Social History   Socioeconomic History  . Marital status: Married    Spouse name: Not on file  . Number of children: 2  . Years of education: College Gr  . Highest education level: Not on file  Occupational History  . Not on file  Tobacco Use  . Smoking status: Never Smoker  . Smokeless tobacco: Never Used  Substance and Sexual Activity  . Alcohol use: No  . Drug use: No  . Sexual activity: Not Currently  Other Topics Concern  . Not on file  Social History Narrative  . Not on file   Social Determinants of Health   Financial Resource Strain:   . Difficulty of Paying Living Expenses: Not on file  Food Insecurity:   . Worried About Charity fundraiser in the Last Year: Not on file  . Ran Out of Food in the Last Year: Not on file  Transportation Needs:   . Lack of Transportation (Medical): Not on file  . Lack of Transportation (Non-Medical): Not on file  Physical Activity:   . Days of Exercise per Week: Not on file  . Minutes of Exercise per Session: Not on file  Stress:   . Feeling of Stress : Not on file  Social Connections:   . Frequency of Communication with Friends and Family: Not on file  . Frequency of Social Gatherings with Friends and Family: Not on file  . Attends Religious Services: Not on file  . Active Member of Clubs or Organizations: Not on file  . Attends Archivist Meetings: Not on file  . Marital Status: Not on file  Intimate Partner Violence:   . Fear of Current or Ex-Partner: Not on file  . Emotionally Abused: Not on file  . Physically Abused: Not on file  . Sexually Abused: Not on file    Past Medical History:  Diagnosis Date  . Sleep apnea      Patient Active Problem List   Diagnosis Date Noted  . Osteopenia of multiple sites 08/28/2019  . OSA (obstructive sleep apnea) 09/16/2016  . Palpitations 05/27/2015  .  Allergic rhinitis 05/26/2015  . Family history of diabetes mellitus 05/26/2015  . Ectopic atrial beats 05/26/2015  . Pre-diabetes 05/26/2015  . Pure hypercholesterolemia 05/26/2015  . Vitamin D deficiency 05/26/2015  . Depressive disorder 04/24/2009  . Urge incontinence 04/22/2008  . Postprocedural state 01/30/2007  . Hypertension, essential, benign 09/14/2005    Past Surgical History:  Procedure Laterality Date  . ABDOMINAL HYSTERECTOMY  2008   Due to heavy bleeding. Complete; does not have ovaries per patient  . COLONOSCOPY WITH PROPOFOL N/A 05/07/2018   Procedure: COLONOSCOPY WITH PROPOFOL;  Surgeon: Manya Silvas, MD;  Location: [facility] ENDOSCOPY;  Service: Endoscopy;  Laterality: N/A;    Her family history includes Breast cancer in her paternal aunt, sister, and sister; Cancer in her sister and sister; Diabetes in her mother; Hypertension in her father.   Current Outpatient Medications:  .  Ergocalciferol (VITAMIN D2) 2000 UNITS TABS, Take 1 tablet by mouth daily., Disp: , Rfl:  .  fluticasone (FLONASE) 50 MCG/ACT nasal spray, SPRAY 2 SPRAYS INTO EACH NOSTRIL EVERY DAY, Disp: 16 g, Rfl: 1 .  telmisartan-hydrochlorothiazide (MICARDIS HCT) 40-12.5 MG tablet, TAKE 1 TABLET DAILY, Disp: 90 tablet, Rfl: 3  Patient  Care Team: Malva Limes, MD as PCP - General (Family Medicine) [facility], [place]     Objective:    Vitals: BP 128/66 (BP Location: Right Arm, Patient Position: Sitting, Cuff Size: Large)   Pulse 78   Temp (!) 97.1 F (36.2 C) (Temporal)   Resp 16   Ht 5\' 2"  (1.575 m)   Wt 178 lb (80.7 kg)   SpO2 99% Comment: room air  BMI 32.56 kg/m   Physical Exam   General Appearance:    Obese female. Alert, cooperative, in no acute distress, appears stated age   Head:    Normocephalic, without obvious abnormality, atraumatic  Eyes:    PERRL, conjunctiva/corneas clear, EOM's intact, fundi    benign, both eyes  Ears:    Normal TM's and external ear canals,  both ears  Nose:   Nares normal, septum midline, mucosa normal, no drainage    or sinus tenderness  Throat:   Lips, mucosa, and tongue normal; teeth and gums normal  Neck:   Supple, symmetrical, trachea midline, no adenopathy;    thyroid:  no enlargement/tenderness/nodules; no carotid   bruit or JVD  Back:     Symmetric, no curvature, ROM normal, no CVA tenderness  Lungs:     Clear to auscultation bilaterally, respirations unlabored  Chest Wall:    No tenderness or deformity   Heart:    Normal heart rate. Normal rhythm. No murmurs, rubs, or gallops.   Breast Exam:    deferred  Abdomen:     Soft, non-tender, bowel sounds active all four quadrants,    no masses, no organomegaly  Pelvic:    deferred  Extremities:   All extremities are intact. No cyanosis or edema  Pulses:   2+ and symmetric all extremities  Skin:   Skin color, texture, turgor normal, no rashes or lesions  Lymph nodes:   Cervical, supraclavicular, and axillary nodes normal  Neurologic:   CNII-XII intact, normal strength, sensation and reflexes    throughout    Activities of Daily Living In your present state of health, do you have any difficulty performing the following activities: 08/28/2019  Hearing? N  Vision? N  Difficulty concentrating or making decisions? N  Walking or climbing stairs? N  Dressing or bathing? N  Doing errands, shopping? N  Some recent data might be hidden    Fall Risk Assessment Fall Risk  08/28/2019  Falls in the past year? 0  Number falls in past yr: 0  Injury with Fall? 0  Follow up Falls evaluation completed     Depression Screen PHQ 2/9 Scores 08/28/2019 04/09/2018 06/28/2016  PHQ - 2 Score 0 0 1  PHQ- 9 Score 0 2 3    No flowsheet data found.     Assessment & Plan:    Annual Physical Reviewed patient's Family Medical History Reviewed and updated list of patient's medical providers Assessment of cognitive impairment was done Assessed patient's functional ability Established a  written schedule for health screening services Health Risk Assessent Completed and Reviewed  Exercise Activities and Dietary recommendations Goals   None     Immunization History  Administered Date(s) Administered  . Influenza, High Dose Seasonal PF 06/13/2018  . Influenza,inj,Quad PF,6+ Mos 05/27/2015, 06/28/2016, 07/23/2019  . Td 04/09/2018  . Tdap 04/22/2008  . Zoster 09/23/2014  . Zoster Recombinat (Shingrix) 04/09/2018, 06/13/2018    Health Maintenance  Topic Date Due  . PNA vac Low Risk Adult (1 of 2 - PCV13) 06/07/2018  .  MAMMOGRAM  07/28/2021  . DEXA SCAN  08/16/2021  . COLONOSCOPY  05/08/2023  . TETANUS/TDAP  04/09/2028  . INFLUENZA VACCINE  Completed  . Hepatitis C Screening  Completed     Discussed health benefits of physical activity, and encouraged her to engage in regular exercise appropriate for her age and condition.    ------------------------------------------------------------------------------------------------------------  1. Annual physical exam Normal exam. Breast and gyn exam deferred to Dr. Tiburcio Pea - Comprehensive metabolic panel - Lipid panel (fasting) - EKG 12-Lead  2. Need for pneumococcal vaccination She is scheduled to get Covid vaccine next week. Out of an abundance of precaution will wait 30 days after completed Covid vaccines before getting Pneumovax  3. Vitamin D deficiency  - Vitamin D (25 hydroxy)  4. Hypertension, essential, benign Well controlled.  Continue current medications.   - Comprehensive metabolic panel - Lipid panel (fasting) - EKG 12-Lead  5. Hyperglycemia  - HgB A1c  6. Pre-diabetes   7. Osteopenia of multiple sites BMD per Dr. Tiburcio Pea. Not yet due.    Mila Merry, MD  [facility]

## 2019-08-29 LAB — COMPREHENSIVE METABOLIC PANEL
ALT: 14 IU/L (ref 0–32)
AST: 12 IU/L (ref 0–40)
Albumin/Globulin Ratio: 1.6 (ref 1.2–2.2)
Albumin: 4.1 g/dL (ref 3.8–4.8)
Alkaline Phosphatase: 67 IU/L (ref 39–117)
BUN/Creatinine Ratio: 24 (ref 12–28)
BUN: 22 mg/dL (ref 8–27)
Bilirubin Total: 0.5 mg/dL (ref 0.0–1.2)
CO2: 25 mmol/L (ref 20–29)
Calcium: 9.3 mg/dL (ref 8.7–10.3)
Chloride: 105 mmol/L (ref 96–106)
Creatinine, Ser: 0.9 mg/dL (ref 0.57–1.00)
GFR calc Af Amer: 77 mL/min/{1.73_m2} (ref >59)
GFR calc non Af Amer: 67 mL/min/{1.73_m2} (ref >59)
Globulin, Total: 2.5 g/dL (ref 1.5–4.5)
Glucose: 99 mg/dL (ref 65–99)
Potassium: 4.1 mmol/L (ref 3.5–5.2)
Sodium: 141 mmol/L (ref 134–144)
Total Protein: 6.6 g/dL (ref 6.0–8.5)

## 2019-08-29 LAB — HEMOGLOBIN A1C
Est. average glucose Bld gHb Est-mCnc: 120 mg/dL
Hgb A1c MFr Bld: 5.8 % — ABNORMAL HIGH (ref 4.8–5.6)

## 2019-08-29 LAB — LIPID PANEL
Chol/HDL Ratio: 3 ratio (ref 0.0–4.4)
Cholesterol, Total: 227 mg/dL — ABNORMAL HIGH (ref 100–199)
HDL: 76 mg/dL (ref >39)
LDL Chol Calc (NIH): 142 mg/dL — ABNORMAL HIGH (ref 0–99)
Triglycerides: 51 mg/dL (ref 0–149)
VLDL Cholesterol Cal: 9 mg/dL (ref 5–40)

## 2019-08-29 LAB — VITAMIN D 25 HYDROXY (VIT D DEFICIENCY, FRACTURES): Vit D, 25-Hydroxy: 34.8 ng/mL (ref 30.0–100.0)

## 2019-11-04 NOTE — Progress Notes (Signed)
        Patient: Beverly Bush   DOB: 03/16/1951   69 y.o. Female  MRN: [id number] Visit Date: 11/05/2019  Today's Provider: Mila Merry, MD  Subjective:    Chief Complaint  Patient presents with  . Neck lump   HPI Neck Lump: This is a new problem. Episode onset: 1 month ago  The problem occurs constantly. The limpis present in the right side. The patient is experiencing no pain. Pertinent negatives include no chest pain, fever or weakness. She has tried nothing for the symptoms.       Medications: Outpatient Medications Prior to Visit  Medication Sig Dispense Refill  . Ergocalciferol (VITAMIN D2) 2000 UNITS TABS Take 1 tablet by mouth daily.    . fluticasone (FLONASE) 50 MCG/ACT nasal spray SPRAY 2 SPRAYS INTO EACH NOSTRIL EVERY DAY 16 g 1  . telmisartan-hydrochlorothiazide (MICARDIS HCT) 40-12.5 MG tablet TAKE 1 TABLET DAILY 90 tablet 3   No facility-administered medications prior to visit.      Review of Systems  Constitutional: Negative for appetite change, chills and fatigue.  Respiratory: Negative for chest tightness and shortness of breath.   Cardiovascular: Negative for palpitations.  Gastrointestinal: Negative for abdominal pain, nausea and vomiting.  Neurological: Negative for dizziness.      Objective:    BP (!) 153/82 (BP Location: Right Arm, Patient Position: Sitting, Cuff Size: Normal)   Pulse 89   Temp (!) 97.1 F (36.2 C) (Temporal)   Wt 179 lb 9.6 oz (81.5 kg)   BMI 32.85 kg/m    Physical Exam   Painless pea sized firm mass at right supraclavicular area just lateral to base of neck  No results found for any visits on 11/05/19.    Assessment & Plan:    1. Allergic rhinitis due to pollen, unspecified seasonality refill - fluticasone (FLONASE) 50 MCG/ACT nasal spray; SPRAY 2 SPRAYS INTO EACH NOSTRIL EVERY DAY  Dispense: 16 g; Refill: 1  2. Supraclavicular mass  - US Soft Tissue Head/Neck (NON-THYROID); Future     Mila Merry,  MD  [facility] [phone] (phone) [phone] (fax)  [facility]

## 2019-11-05 ENCOUNTER — Other Ambulatory Visit: Payer: Self-pay

## 2019-11-05 ENCOUNTER — Ambulatory Visit (INDEPENDENT_AMBULATORY_CARE_PROVIDER_SITE_OTHER): Payer: BC Managed Care – PPO | Admitting: Family Medicine

## 2019-11-05 ENCOUNTER — Encounter: Payer: Self-pay | Admitting: Family Medicine

## 2019-11-05 VITALS — BP 153/82 | HR 89 | Temp 97.1°F | Wt 179.6 lb

## 2019-11-05 DIAGNOSIS — R222 Localized swelling, mass and lump, trunk: Secondary | ICD-10-CM | POA: Diagnosis not present

## 2019-11-05 DIAGNOSIS — J301 Allergic rhinitis due to pollen: Secondary | ICD-10-CM | POA: Diagnosis not present

## 2019-11-05 MED ORDER — FLUTICASONE PROPIONATE 50 MCG/ACT NA SUSP: NASAL | 1 refills | Status: DC

## 2019-11-07 ENCOUNTER — Ambulatory Visit: Payer: BC Managed Care – PPO

## 2019-11-08 ENCOUNTER — Ambulatory Visit
Admission: RE | Admit: 2019-11-08 | Discharge: 2019-11-08 | Disposition: A | Discharge status: Home / Self Care | Payer: BC Managed Care – PPO | Source: Ambulatory Visit | Attending: Family Medicine | Admitting: Family Medicine

## 2019-11-08 ENCOUNTER — Telehealth: Payer: Self-pay

## 2019-11-08 ENCOUNTER — Other Ambulatory Visit: Payer: Self-pay

## 2019-11-08 DIAGNOSIS — R222 Localized swelling, mass and lump, trunk: Secondary | ICD-10-CM | POA: Diagnosis present

## 2019-11-08 NOTE — Telephone Encounter (Signed)
-----   Message from Malva Limes, MD sent at 11/08/2019 10:52 AM EDT ----- Ultrasound shows a benign appearing lymph node. It should go down over the next few months. Let me know if knot resolved by June, in which case we would repeat the ultrasound.

## 2019-11-08 NOTE — Telephone Encounter (Signed)
Patient returning call for result. Patient requesting call back.

## 2019-11-08 NOTE — Telephone Encounter (Signed)
Called to advise patient of ultrasound results, no answer and VM full. Will try to contact patient again later.

## 2019-11-08 NOTE — Telephone Encounter (Signed)
Patient advised.

## 2019-11-20 ENCOUNTER — Ambulatory Visit: Payer: Self-pay | Admitting: Family Medicine

## 2019-12-04 ENCOUNTER — Other Ambulatory Visit: Payer: Self-pay | Admitting: Family Medicine

## 2019-12-04 DIAGNOSIS — J301 Allergic rhinitis due to pollen: Secondary | ICD-10-CM

## 2019-12-11 NOTE — Progress Notes (Signed)
    Established patient visit    Patient: Beverly Bush   DOB: 1951/03/14   69 y.o. Female  MRN: [id number] Visit Date: 12/17/2019  Today's healthcare provider: Mila Merry, MD   Chief Complaint  Patient presents with  . Immunizations   Subjective    HPI Patient here today for pneumovax and EKG. Patient reports feeling well denies any URI symptoms. Patient tolerated injection well. Observed for 15 minutes no adverse reaction noted.   EKG was out of order when she was here for CPE so she returns to have it down today.  EKG reviewed by MD as part of her previous CPE visit. Patient not seen by MD today.  Given Pneumovax-23 today.

## 2019-12-17 ENCOUNTER — Other Ambulatory Visit: Payer: Self-pay

## 2019-12-17 ENCOUNTER — Encounter: Payer: Self-pay | Admitting: Family Medicine

## 2019-12-17 ENCOUNTER — Ambulatory Visit (INDEPENDENT_AMBULATORY_CARE_PROVIDER_SITE_OTHER): Payer: BC Managed Care – PPO | Admitting: Family Medicine

## 2019-12-17 VITALS — Temp 97.1°F | Resp 16 | Ht 62.0 in | Wt 180.0 lb

## 2019-12-17 DIAGNOSIS — Z23 Encounter for immunization: Secondary | ICD-10-CM

## 2019-12-17 DIAGNOSIS — I10 Essential (primary) hypertension: Secondary | ICD-10-CM

## 2019-12-28 ENCOUNTER — Other Ambulatory Visit: Payer: Self-pay | Admitting: Family Medicine

## 2019-12-28 DIAGNOSIS — J301 Allergic rhinitis due to pollen: Secondary | ICD-10-CM

## 2019-12-28 NOTE — Telephone Encounter (Signed)
Requested Prescriptions  Pending Prescriptions Disp Refills  . fluticasone (FLONASE) 50 MCG/ACT nasal spray [Pharmacy Med Name: FLUTICASONE PROP 50 MCG SPRAY] 16 mL 1    Sig: SPRAY 2 SPRAYS INTO EACH NOSTRIL EVERY DAY     Ear, Nose, and Throat: Nasal Preparations - Corticosteroids Passed - 12/28/2019 11:37 AM      Passed - Valid encounter within last 12 months    Recent Outpatient Visits          1 week ago Hypertension, essential, benign   [facility] Malva Limes, MD   1 month ago Supraclavicular mass   [facility] Malva Limes, MD   4 months ago Annual physical exam   [facility] Malva Limes, MD   1 year ago Need for influenza vaccination   [facility] Malva Limes, MD   1 year ago Annual physical exam   [facility] Malva Limes, MD

## 2020-02-01 ENCOUNTER — Other Ambulatory Visit: Payer: Self-pay | Admitting: Family Medicine

## 2020-02-01 DIAGNOSIS — J301 Allergic rhinitis due to pollen: Secondary | ICD-10-CM

## 2020-02-01 NOTE — Telephone Encounter (Signed)
Requested Prescriptions  Pending Prescriptions Disp Refills   fluticasone (FLONASE) 50 MCG/ACT nasal spray [Pharmacy Med Name: FLUTICASONE PROP 50 MCG SPRAY] 16 mL 1    Sig: SPRAY 2 SPRAYS INTO EACH NOSTRIL EVERY DAY     Ear, Nose, and Throat: Nasal Preparations - Corticosteroids Passed - 02/01/2020  9:36 AM      Passed - Valid encounter within last 12 months    Recent Outpatient Visits          1 month ago Hypertension, essential, benign   [facility] Malva Limes, MD   2 months ago Supraclavicular mass   [facility] Malva Limes, MD   5 months ago Annual physical exam   [facility] Malva Limes, MD   1 year ago Need for influenza vaccination   [facility] Malva Limes, MD   1 year ago Annual physical exam   [facility] Malva Limes, MD

## 2020-02-15 ENCOUNTER — Other Ambulatory Visit: Payer: Self-pay | Admitting: Family Medicine

## 2020-02-15 DIAGNOSIS — J301 Allergic rhinitis due to pollen: Secondary | ICD-10-CM

## 2020-02-15 NOTE — Telephone Encounter (Signed)
Requested medication (s) are due for refill today: no  Requested medication (s) are on the active medication list: yes  Last refill:  02/01/20 16 ml with 1 RF  Future visit scheduled: no  Notes to clinic:  pt requesting 90 day prescription   Requested Prescriptions  Pending Prescriptions Disp Refills   fluticasone (FLONASE) 50 MCG/ACT nasal spray [Pharmacy Med Name: FLUTICASONE PROP 50 MCG SPRAY] 48 mL 1    Sig: SPRAY 2 SPRAYS INTO EACH NOSTRIL EVERY DAY      Ear, Nose, and Throat: Nasal Preparations - Corticosteroids Passed - 02/15/2020  3:22 PM      Passed - Valid encounter within last 12 months    Recent Outpatient Visits           2 months ago Hypertension, essential, benign   [facility] Malva Limes, MD   3 months ago Supraclavicular mass   [facility] Malva Limes, MD   5 months ago Annual physical exam   [facility] Malva Limes, MD   1 year ago Need for influenza vaccination   [facility] Malva Limes, MD   1 year ago Annual physical exam   [facility] Malva Limes, MD

## 2020-03-27 ENCOUNTER — Telehealth: Payer: Self-pay | Admitting: Family Medicine

## 2020-03-27 DIAGNOSIS — I10 Essential (primary) hypertension: Secondary | ICD-10-CM

## 2020-03-27 MED ORDER — VALSARTAN-HYDROCHLOROTHIAZIDE 160-12.5 MG PO TABS: 1.0000 | ORAL_TABLET | Freq: Every day | ORAL | 1 refills | Status: DC

## 2020-03-27 NOTE — Telephone Encounter (Signed)
Patient's insurance does not cover Telmisartan HCTZ.  Patient would like for you to send in something compatible to this medication to [facility] mail order pharmacy.

## 2020-04-09 ENCOUNTER — Telehealth: Payer: Self-pay

## 2020-04-09 DIAGNOSIS — I10 Essential (primary) hypertension: Secondary | ICD-10-CM

## 2020-04-09 NOTE — Telephone Encounter (Signed)
Please clarify; is this a medication refill? If so, what medication is being requested Thank you   Copied from CRM [id number]. Topic: General - Call Back - No Documentation >> Apr 09, 2020 11:42 AM Reuben Likes D wrote: Reason for CRM: Patient wanted to know if PCP could contact [facility] in regards to getting her RX's because she is about to run out. Patient states the number that PCP provided to her is the number to the doctor's office not [facility]. Please call patient back with resolution.

## 2020-04-10 NOTE — Telephone Encounter (Signed)
Patient is calling to see if Clinical staff can call [facility] to ask why she has not received valsartan-hydrochlorothiazide (DIOVAN HCT) 160-12.5 MG tablet [[id number]] Please advise CB- [phone]

## 2020-04-14 MED ORDER — VALSARTAN-HYDROCHLOROTHIAZIDE 160-12.5 MG PO TABS: 1.0000 | ORAL_TABLET | Freq: Every day | ORAL | 1 refills | Status: DC

## 2020-04-14 MED ORDER — VALSARTAN-HYDROCHLOROTHIAZIDE 160-12.5 MG PO TABS: 1.0000 | ORAL_TABLET | Freq: Every day | ORAL | 0 refills | Status: DC

## 2020-04-14 NOTE — Telephone Encounter (Signed)
This was received by the pharmacy on 03/27/20. However, pharmacy reports they did not receive the Rx. Will try sending it again. L/M notifying the pt.

## 2020-04-14 NOTE — Telephone Encounter (Signed)
Pt called and is requesting to have the prescription sent through [facility] for the full prescription. [facility]. Please advise.       [id number] [place] [place] - [address]  [address] [place] [address]  Phone: [phone] Fax: [phone]  Hours: Not open 24 hours   [facility] - [place], [place] - [address]  [address] [place] [address]  Phone: [phone] Fax: [phone]  Hours: Not open 24 hours

## 2020-04-14 NOTE — Telephone Encounter (Signed)
Rx was sent in to [facility] [place] for 30 day supply, and was also sent into [facility] for 90 day supply.

## 2020-04-14 NOTE — Addendum Note (Signed)
Addended by: Anson [place] on: 04/14/2020 09:26 AM   Modules accepted: Orders

## 2020-04-14 NOTE — Addendum Note (Signed)
Addended by: Anson [place] on: 04/14/2020 11:50 AM   Modules accepted: Orders

## 2020-05-07 ENCOUNTER — Ambulatory Visit: Admit: 2020-05-07 | Payer: BC Managed Care – PPO | Admitting: Unknown Physician Specialty

## 2020-05-07 ENCOUNTER — Other Ambulatory Visit: Payer: Self-pay | Admitting: Family Medicine

## 2020-05-07 DIAGNOSIS — I10 Essential (primary) hypertension: Secondary | ICD-10-CM

## 2020-05-07 NOTE — Telephone Encounter (Signed)
Attempted to call patient to verify that she has received her mail order Rx. Left message that we will deny this Rx on assumption that she has received mail order. Please call if she has had changes.

## 2020-06-04 ENCOUNTER — Other Ambulatory Visit: Payer: Self-pay | Admitting: Family Medicine

## 2020-06-04 DIAGNOSIS — I10 Essential (primary) hypertension: Secondary | ICD-10-CM

## 2020-07-22 DIAGNOSIS — Z20822 Contact with and (suspected) exposure to covid-19: Secondary | ICD-10-CM | POA: Diagnosis not present

## 2020-08-05 ENCOUNTER — Ambulatory Visit (INDEPENDENT_AMBULATORY_CARE_PROVIDER_SITE_OTHER): Payer: Medicare HMO | Admitting: Obstetrics & Gynecology

## 2020-08-05 ENCOUNTER — Other Ambulatory Visit: Payer: Self-pay

## 2020-08-05 ENCOUNTER — Encounter: Payer: Self-pay | Admitting: Obstetrics & Gynecology

## 2020-08-05 VITALS — BP 120/80 | Ht 63.0 in | Wt 175.0 lb

## 2020-08-05 DIAGNOSIS — Z01419 Encounter for gynecological examination (general) (routine) without abnormal findings: Secondary | ICD-10-CM

## 2020-08-05 DIAGNOSIS — Z1211 Encounter for screening for malignant neoplasm of colon: Secondary | ICD-10-CM

## 2020-08-05 DIAGNOSIS — Z1239 Encounter for other screening for malignant neoplasm of breast: Secondary | ICD-10-CM | POA: Diagnosis not present

## 2020-08-05 NOTE — Patient Instructions (Addendum)
PAP every 5 years Mammogram every year    Call [phone] to schedule at [facility] Colonoscopy every 10 years Labs yearly (with PCP) Dermatology - appointment every 1-2 years  Thank you for choosing [facility]. As part of our ongoing efforts to improve patient experience, we would appreciate your feedback. Please fill out the short survey that you will receive by mail or MyChart. Your opinion is important to [place]! - Dr. Tiburcio Pea  Recommendations to boost your immunity to prevent illness such as viral flu and colds, including covid19, are as follows: Vitamin K2 and Vitamin D3. Take Vitamin K2 at 200-300 mcg daily (usually 2-3 pills daily of the over the counter formulation). Take Vitamin D3 at 3000-4000 U daily (usually 3-4 pills daily of the over the counter formulation). Studies show that these two at high normal levels in your system are very effective in keeping your immunity so strong and protective that you will be unlikely to contract viral illness such as those listed above.  Dr Tiburcio Pea

## 2020-08-05 NOTE — Progress Notes (Signed)
HPI:      [name] is a 69 y.o. G3P2 who LMP was in the past, she presents today for her annual examination.  The patient has no complaints today.  Occas urinary urgency and even leakage at times. The patient is sexually active. Her last pap: approximate date 2018 (and s/p hyst) and was normal and last mammogram: approximate date 2020 and was normal.  The patient does perform self breast exams.  There is no notable family history of breast or ovarian cancer in her family. The patient is not taking hormone replacement therapy. Patient denies post-menopausal vaginal bleeding.   The patient has regular exercise: yes. The patient denies current symptoms of depression.    GYN Hx: Last Colonoscopy:2 years ago. Normal.   PMHx: Past Medical History:  Diagnosis Date  . Sleep apnea    Past Surgical History:  Procedure Laterality Date  . ABDOMINAL HYSTERECTOMY  2008   Due to heavy bleeding. Complete; does not have ovaries per patient  . COLONOSCOPY WITH PROPOFOL N/A 05/07/2018   Procedure: COLONOSCOPY WITH PROPOFOL;  Surgeon: Scot Jun, MD;  Location[facility] ENDOSCOPY;  Service: Endoscopy;  Laterality: N/A;   Family History  Problem Relation Age of Onset  . Diabetes Mother        non insulin dependant diabetes mellitus  . Hypertension Father   . Cancer Sister        breast  . Breast cancer Sister   . Cancer Sister        colon cancer  . Breast cancer Sister   . Breast cancer Paternal Aunt    Social History   Tobacco Use  . Smoking status: Never Smoker  . Smokeless tobacco: Never Used  Substance Use Topics  . Alcohol use: No  . Drug use: No    Current Outpatient Medications:  .  Ergocalciferol (VITAMIN D2) 2000 UNITS TABS, Take 1 tablet by mouth daily., Disp: , Rfl:  .  fluticasone (FLONASE) 50 MCG/ACT nasal spray, SPRAY 2 SPRAYS INTO EACH NOSTRIL EVERY DAY, Disp: 48 mL, Rfl: 2 .  valsartan-hydrochlorothiazide (DIOVAN-HCT) 160-12.5 MG tablet, TAKE 1 TABLET BY MOUTH  DAILY. TAKE IN PLACE OF TELMISARTAN-HCTZ, Disp: 90 tablet, Rfl: 0 Allergies: Patient has no known allergies.  Review of Systems  Constitutional: Negative for chills, fever and malaise/fatigue.  HENT: Negative for congestion, sinus pain and sore throat.   Eyes: Negative for blurred vision and pain.  Respiratory: Negative for cough and wheezing.   Cardiovascular: Negative for chest pain and leg swelling.  Gastrointestinal: Negative for abdominal pain, constipation, diarrhea, heartburn, nausea and vomiting.  Genitourinary: Negative for dysuria, frequency, hematuria and urgency.  Musculoskeletal: Negative for back pain, joint pain, myalgias and neck pain.  Skin: Negative for itching and rash.  Neurological: Negative for dizziness, tremors and weakness.  Endo/Heme/Allergies: Does not bruise/bleed easily.  Psychiatric/Behavioral: Negative for depression. The patient is not nervous/anxious and does not have insomnia.     Objective: BP 120/80   Ht 5\' 3"  (1.6 m)   Wt 175 lb (79.4 kg)   BMI 31.00 kg/m   Filed Weights   08/05/20 1359  Weight: 175 lb (79.4 kg)   Body mass index is 31 kg/m. Physical Exam Constitutional:      General: She is not in acute distress.    Appearance: She is well-developed.  Genitourinary:     Vulva, urethra, bladder, vagina and rectum normal.     No lesions in the vagina.  Genitourinary Comments: Vaginal cuff well healed     No vaginal bleeding.      Right Adnexa: not tender and no mass present.    Left Adnexa: not tender and no mass present.    Cervix is absent.     Uterus is absent.     Pelvic exam was performed with patient supine.  Breasts:     Right: No mass, skin change or tenderness.     Left: No mass, skin change or tenderness.    HENT:     Head: Normocephalic and atraumatic. No laceration.     Right Ear: Hearing normal.     Left Ear: Hearing normal.     Mouth/Throat:     Pharynx: Uvula midline.  Eyes:     Pupils: Pupils are equal,  round, and reactive to light.  Neck:     Thyroid: No thyromegaly.  Cardiovascular:     Rate and Rhythm: Normal rate and regular rhythm.     Heart sounds: No murmur heard. No friction rub. No gallop.   Pulmonary:     Effort: Pulmonary effort is normal. No respiratory distress.     Breath sounds: Normal breath sounds. No wheezing.  Abdominal:     General: Bowel sounds are normal. There is no distension.     Palpations: Abdomen is soft.     Tenderness: There is no abdominal tenderness. There is no rebound.  Musculoskeletal:        General: Normal range of motion.     Cervical back: Normal range of motion and neck supple.  Neurological:     Mental Status: She is alert and oriented to person, place, and time.     Cranial Nerves: No cranial nerve deficit.  Skin:    General: Skin is warm and dry.  Psychiatric:        Judgment: Judgment normal.  Vitals reviewed.    Assessment: Annual Exam 1. Women's annual routine gynecological examination   2. Encounter for screening for malignant neoplasm of breast, unspecified screening modality   3. Screen for colon cancer     Plan:            1.  Vaginal Screening-  Pap smear schedule reviewed with patient  2. Breast screening- Exam annually and mammogram scheduled  3. Colonoscopy every 5 years, Hemoccult testing after age 81  4. Labs managed by PCP  5. Counseling for hormonal therapy: none              6. FRAX - FRAX score for assessing the 10 year probability for fracture calculated and discussed today.  Based on age and score today, DEXA is not currently scheduled.    F/U  Return in about 1 year (around 08/05/2021) for Annual.  Annamarie Major, MD, [facility], [facility] 08/05/2020  2:03 PM

## 2020-08-10 ENCOUNTER — Other Ambulatory Visit: Payer: Self-pay

## 2020-08-10 ENCOUNTER — Ambulatory Visit
Admission: RE | Admit: 2020-08-10 | Discharge: 2020-08-10 | Disposition: A | Discharge status: Home / Self Care | Payer: Medicare HMO | Source: Ambulatory Visit | Attending: Obstetrics & Gynecology | Admitting: Obstetrics & Gynecology

## 2020-08-10 DIAGNOSIS — Z1231 Encounter for screening mammogram for malignant neoplasm of breast: Secondary | ICD-10-CM | POA: Diagnosis not present

## 2020-08-10 DIAGNOSIS — Z1239 Encounter for other screening for malignant neoplasm of breast: Secondary | ICD-10-CM

## 2020-09-02 DIAGNOSIS — Z1211 Encounter for screening for malignant neoplasm of colon: Secondary | ICD-10-CM | POA: Diagnosis not present

## 2020-09-03 LAB — SPECIMEN STATUS REPORT

## 2020-09-03 LAB — FECAL OCCULT BLOOD, IMMUNOCHEMICAL: Fecal Occult Bld: NEGATIVE

## 2020-10-28 ENCOUNTER — Telehealth: Payer: Self-pay | Admitting: Family Medicine

## 2020-10-28 DIAGNOSIS — I10 Essential (primary) hypertension: Secondary | ICD-10-CM

## 2020-10-28 NOTE — Telephone Encounter (Signed)
Requested medication (s) are due for refill today: no  Requested medication (s) are on the active medication list; yes  Last refill: 08/03/2020  Future visit scheduled:  no  Notes to clinic:  overdue for follow up appointment  Message sent for patient to contact office for follow up    Requested Prescriptions  Pending Prescriptions Disp Refills   valsartan-hydrochlorothiazide (DIOVAN-HCT) 160-12.5 MG tablet [Pharmacy Med Name: VALSARTAN/HYDROCHLOROTHIAZIDE 160-12.5 MG Tablet] 90 tablet 0    Sig: TAKE 1 TABLET EVERY DAY (TAKE IN PLACE OF TELMISARTAN/HCTZ)      Cardiovascular: ARB + Diuretic Combos Failed - 10/28/2020  1:43 PM      Failed - K in normal range and within 180 days    Potassium  Date Value Ref Range Status  08/28/2019 4.1 3.5 - 5.2 mmol/L Final          Failed - Na in normal range and within 180 days    Sodium  Date Value Ref Range Status  08/28/2019 141 134 - 144 mmol/L Final          Failed - Cr in normal range and within 180 days    Creatinine, Ser  Date Value Ref Range Status  08/28/2019 0.90 0.57 - 1.00 mg/dL Final          Failed - Ca in normal range and within 180 days    Calcium  Date Value Ref Range Status  08/28/2019 9.3 8.7 - 10.3 mg/dL Final          Failed - Valid encounter within last 6 months    Recent Outpatient Visits           10 months ago Hypertension, essential, benign   [facility] Malva Limes, MD   11 months ago Supraclavicular mass   [facility] Malva Limes, MD   1 year ago Annual physical exam   [facility] Malva Limes, MD   2 years ago Need for influenza vaccination   [facility] Malva Limes, MD   2 years ago Annual physical exam   [facility] Malva Limes, MD                Passed - Patient is not pregnant      Passed - Last BP in normal range    BP Readings from Last 1 Encounters:  08/05/20 120/80

## 2020-11-02 MED ORDER — VALSARTAN-HYDROCHLOROTHIAZIDE 160-12.5 MG PO TABS: 1.0000 | ORAL_TABLET | Freq: Every day | ORAL | 0 refills | Status: DC

## 2020-11-02 NOTE — Addendum Note (Signed)
Addended by: Malva Limes on: 11/02/2020 08:20 AM   Modules accepted: Orders

## 2020-11-02 NOTE — Telephone Encounter (Signed)
Patient advised. Follow up appointment scheduled for 11/17/2020 at 2:40pm.

## 2020-11-02 NOTE — Telephone Encounter (Signed)
Please advise patient that 90 day prescription of her bp medication was sent to [facility], but she is overdue for follow up and needs to schedule within the next month.

## 2020-11-17 ENCOUNTER — Ambulatory Visit (INDEPENDENT_AMBULATORY_CARE_PROVIDER_SITE_OTHER): Payer: Medicare HMO | Admitting: Family Medicine

## 2020-11-17 ENCOUNTER — Other Ambulatory Visit: Payer: Self-pay

## 2020-11-17 VITALS — BP 135/64 | HR 95 | Temp 98.2°F | Wt 182.0 lb

## 2020-11-17 DIAGNOSIS — I10 Essential (primary) hypertension: Secondary | ICD-10-CM

## 2020-11-17 DIAGNOSIS — M7552 Bursitis of left shoulder: Secondary | ICD-10-CM | POA: Diagnosis not present

## 2020-11-17 DIAGNOSIS — E559 Vitamin D deficiency, unspecified: Secondary | ICD-10-CM | POA: Diagnosis not present

## 2020-11-17 DIAGNOSIS — R7303 Prediabetes: Secondary | ICD-10-CM | POA: Diagnosis not present

## 2020-11-17 DIAGNOSIS — E7800 Pure hypercholesterolemia, unspecified: Secondary | ICD-10-CM | POA: Diagnosis not present

## 2020-11-17 DIAGNOSIS — G4710 Hypersomnia, unspecified: Secondary | ICD-10-CM | POA: Diagnosis not present

## 2020-11-17 MED ORDER — NAPROXEN 500 MG PO TABS: 500.0000 mg | ORAL_TABLET | Freq: Two times a day (BID) | ORAL | 0 refills | Status: DC

## 2020-11-17 NOTE — Patient Instructions (Addendum)
Please review the attached list of medications and notify my office if there are any errors.   It is recommended to engage in 150 minutes of moderate exercise every week.    Please go to the lab draw station in [address] on the second floor of [facility]  when you are fasting for 8 hours. Normal hours are 8:00am to 11:30am and 1:00pm to 4:00pm Monday through Friday  

## 2020-11-17 NOTE — Progress Notes (Signed)
Established patient visit   Patient: Beverly Bush   DOB: 1951-01-30   70 y.o. Female  MRN: [id number] Visit Date: 11/17/2020  Today's healthcare provider: Mila Merry, MD   No chief complaint on file.  Subjective    HPI   Hypertension, follow-up  BP Readings from Last 3 Encounters:  11/17/20 135/64  08/05/20 120/80  11/05/19 (!) 153/82   Wt Readings from Last 3 Encounters:  11/17/20 182 lb (82.6 kg)  08/05/20 175 lb (79.4 kg)  12/17/19 180 lb (81.6 kg)     She was last seen for hypertension 8 months ago.  BP at that visit was as above. Management since that visit includes none. At that time patient came to office to update Pneumonia vaccine and get EKG.  She reports good compliance with treatment. She is not having side effects.  She is following a Regular diet. She is not exercising. She does not smoke.  Use of agents associated with hypertension: none.   Outside blood pressures are not being checked. Symptoms: No chest pain No chest pressure  No palpitations No syncope  No dyspnea No orthopnea  No paroxysmal nocturnal dyspnea No lower extremity edema   Pertinent labs: Lab Results  Component Value Date   CHOL 227 (H) 08/28/2019   HDL 76 08/28/2019   LDLCALC 142 (H) 08/28/2019   TRIG 51 08/28/2019   CHOLHDL 3.0 08/28/2019   Lab Results  Component Value Date   NA 141 08/28/2019   K 4.1 08/28/2019   CREATININE 0.90 08/28/2019   GFRNONAA 67 08/28/2019   GFRAA 77 08/28/2019   GLUCOSE 99 08/28/2019     The 10-year ASCVD risk score (Goff DC Jr., et al., 2013) is: 12.4%   ---------------------------------------------------------------------------------------------------   She also complains of pain in her left shoulder for a few months, difficult to lift arm above head.   Also having trouble with her CPAP machine. Has had current machine since 2018 on constant pressure, but no longer seems to be effective, having trouble keeping it on through  the night, making her mouth very dry, and feeling sleepy during the day.    Medications: Outpatient Medications Prior to Visit  Medication Sig  . Ergocalciferol (VITAMIN D2) 2000 UNITS TABS Take 1 tablet by mouth daily.  . fluticasone (FLONASE) 50 MCG/ACT nasal spray SPRAY 2 SPRAYS INTO EACH NOSTRIL EVERY DAY  . valsartan-hydrochlorothiazide (DIOVAN-HCT) 160-12.5 MG tablet Take 1 tablet by mouth daily. Please schedule office visit before any future refills.   No facility-administered medications prior to visit.       Objective    BP 135/64 (BP Location: Right Arm, Patient Position: Sitting, Cuff Size: Normal)   Pulse 95   Temp 98.2 F (36.8 C) (Oral)   Wt 182 lb (82.6 kg)   SpO2 99%   BMI 32.24 kg/m     Physical Exam    General: Appearance:    Obese female in no acute distress  Eyes:    PERRL, conjunctiva/corneas clear, EOM's intact       Lungs:     Clear to auscultation bilaterally, respirations unlabored  Heart:    Normal heart rate. Normal rhythm. No murmurs, rubs, or gallops.   MS:   All extremities are intact. Pain limits of left shoulder rotation and abduction over 90 degrees.   Neurologic:   Awake, alert, oriented x 3. No apparent focal neurological           defect.  Assessment & Plan     1. Primary hypertension Very well controlled, Continue current medications.  Encouraged regular exercise.   2. Vitamin D deficiency  - VITAMIN D 25 Hydroxy (Vit-D Deficiency, Fractures)  3. Pre-diabetes  - Hemoglobin A1c  4. Pure hypercholesterolemia Diet controlled - CBC - Comprehensive metabolic panel - Lipid panel  5. Bursitis of left shoulder  - naproxen (NAPROSYN) 500 MG tablet; Take 1 tablet (500 mg total) by mouth 2 (two) times daily with a meal.  Dispense: 30 tablet; Refill: 0  Can call back for OT referral if not improving with oral NSAIDs.   6. Hypersomnia Current CPAP machine is on constant setting and no longer seems to be effective.  - Home  sleep test       The entirety of the information documented in the History of Present Illness, Review of Systems and Physical Exam were personally obtained by me. Portions of this information were initially documented by the CMA and reviewed by me for thoroughness and accuracy.      Mila Merry, MD  [facility] [phone] (phone) [phone] (fax)  [facility]

## 2020-11-19 DIAGNOSIS — E7800 Pure hypercholesterolemia, unspecified: Secondary | ICD-10-CM | POA: Diagnosis not present

## 2020-11-19 DIAGNOSIS — R7303 Prediabetes: Secondary | ICD-10-CM | POA: Diagnosis not present

## 2020-11-19 DIAGNOSIS — E559 Vitamin D deficiency, unspecified: Secondary | ICD-10-CM | POA: Diagnosis not present

## 2020-11-20 LAB — COMPREHENSIVE METABOLIC PANEL
ALT: 16 IU/L (ref 0–32)
AST: 17 IU/L (ref 0–40)
Albumin/Globulin Ratio: 1.4 (ref 1.2–2.2)
Albumin: 3.8 g/dL (ref 3.8–4.8)
Alkaline Phosphatase: 59 IU/L (ref 44–121)
BUN/Creatinine Ratio: 17 (ref 12–28)
BUN: 14 mg/dL (ref 8–27)
Bilirubin Total: 0.7 mg/dL (ref 0.0–1.2)
CO2: 25 mmol/L (ref 20–29)
Calcium: 9.1 mg/dL (ref 8.7–10.3)
Chloride: 108 mmol/L — ABNORMAL HIGH (ref 96–106)
Creatinine, Ser: 0.83 mg/dL (ref 0.57–1.00)
Globulin, Total: 2.8 g/dL (ref 1.5–4.5)
Glucose: 100 mg/dL — ABNORMAL HIGH (ref 65–99)
Potassium: 4.6 mmol/L (ref 3.5–5.2)
Sodium: 147 mmol/L — ABNORMAL HIGH (ref 134–144)
Total Protein: 6.6 g/dL (ref 6.0–8.5)
eGFR: 77 mL/min/{1.73_m2} (ref >59)

## 2020-11-20 LAB — CBC
Hematocrit: 36 % (ref 34.0–46.6)
Hemoglobin: 11.8 g/dL (ref 11.1–15.9)
MCH: 29.1 pg (ref 26.6–33.0)
MCHC: 32.8 g/dL (ref 31.5–35.7)
MCV: 89 fL (ref 79–97)
Platelets: 178 10*3/uL (ref 150–450)
RBC: 4.05 x10E6/uL (ref 3.77–5.28)
RDW: 12.2 % (ref 11.7–15.4)
WBC: 5.5 10*3/uL (ref 3.4–10.8)

## 2020-11-20 LAB — LIPID PANEL
Chol/HDL Ratio: 2.8 ratio (ref 0.0–4.4)
Cholesterol, Total: 218 mg/dL — ABNORMAL HIGH (ref 100–199)
HDL: 78 mg/dL (ref >39)
LDL Chol Calc (NIH): 131 mg/dL — ABNORMAL HIGH (ref 0–99)
Triglycerides: 54 mg/dL (ref 0–149)
VLDL Cholesterol Cal: 9 mg/dL (ref 5–40)

## 2020-11-20 LAB — VITAMIN D 25 HYDROXY (VIT D DEFICIENCY, FRACTURES): Vit D, 25-Hydroxy: 46.1 ng/mL (ref 30.0–100.0)

## 2020-11-20 LAB — HEMOGLOBIN A1C
Est. average glucose Bld gHb Est-mCnc: 126 mg/dL
Hgb A1c MFr Bld: 6 % — ABNORMAL HIGH (ref 4.8–5.6)

## 2020-12-14 DIAGNOSIS — H5203 Hypermetropia, bilateral: Secondary | ICD-10-CM | POA: Diagnosis not present

## 2021-01-05 ENCOUNTER — Other Ambulatory Visit: Payer: Self-pay | Admitting: Family Medicine

## 2021-01-05 DIAGNOSIS — I10 Essential (primary) hypertension: Secondary | ICD-10-CM

## 2021-02-12 ENCOUNTER — Ambulatory Visit: Payer: Medicare HMO | Attending: Neurology

## 2021-02-12 DIAGNOSIS — G4710 Hypersomnia, unspecified: Secondary | ICD-10-CM | POA: Diagnosis present

## 2021-02-12 DIAGNOSIS — G4733 Obstructive sleep apnea (adult) (pediatric): Secondary | ICD-10-CM | POA: Insufficient documentation

## 2021-02-12 DIAGNOSIS — I10 Essential (primary) hypertension: Secondary | ICD-10-CM | POA: Insufficient documentation

## 2021-03-01 ENCOUNTER — Telehealth: Payer: Self-pay | Admitting: Family Medicine

## 2021-03-01 NOTE — Telephone Encounter (Signed)
Sleep study shows moderate to severe apnea. Need to to get on auto titration CPAP. Have sent order to respiratory therapist, she should get a call from them sometime in the next week or so.

## 2021-03-02 NOTE — Telephone Encounter (Signed)
Patient advised and agrees with having CPAP auto titration.

## 2021-03-05 ENCOUNTER — Ambulatory Visit: Payer: Medicare HMO | Admitting: Family Medicine

## 2021-03-15 ENCOUNTER — Ambulatory Visit: Payer: Self-pay | Admitting: *Deleted

## 2021-03-15 MED ORDER — BENZONATATE 200 MG PO CAPS: 200.0000 mg | ORAL_CAPSULE | Freq: Two times a day (BID) | ORAL | 0 refills | Status: DC | PRN

## 2021-03-15 MED ORDER — NIRMATRELVIR/RITONAVIR (PAXLOVID)TABLET: 3.0000 | ORAL_TABLET | Freq: Two times a day (BID) | ORAL | 0 refills | Status: AC

## 2021-03-15 NOTE — Telephone Encounter (Signed)
Pt called stating that she tested positive for covid on 03/12/21. She states that she is feeling better with a remaining cough and headache. She states that she is looking for advice on what she should do from here. Please advise.   Attempted to call patient- left message on VM to call office

## 2021-03-15 NOTE — Telephone Encounter (Signed)
Patient advised and agrees with treatment plan. Prescriptions sent into the pharmacy.

## 2021-03-15 NOTE — Telephone Encounter (Signed)
Recommend paxlovid for Covid since she has only had one booster. Recommend tessalon for cough. Can send to pharmacy of her choice if she likes.

## 2021-03-15 NOTE — Telephone Encounter (Signed)
Patient called back and requesting advise of treatment for continuous cough. C/o cough with clear mucus noted. Positive with at home test on Friday 03/12/21. Symptoms started last Tuesday  and patient tested negative for covid. Symptoms worsened prior to testing positive for covid. Please advise of medication for cough. Care advise given. Patient verbalized understanding  of care advise and to call back or go to [facility] or ED if symptoms worsen. No available appt this week.   Reason for Disposition  [1] Continuous (nonstop) coughing interferes with work or school AND [2] no improvement using cough treatment per Care Advice  Answer Assessment - Initial Assessment Questions 1. COVID-19 DIAGNOSIS: "Who made your COVID-19 diagnosis?" "Was it confirmed by a positive lab test or self-test?" If not diagnosed by a doctor (or NP/PA), ask "Are there lots of cases (community spread) where you live?" Note: See public health department website, if unsure.     At home covid test positive  2. COVID-19 EXPOSURE: "Was there any known exposure to COVID before the symptoms began?" CDC Definition of close contact: within 6 feet (2 meters) for a total of 15 minutes or more over a 24-hour period.      Not sure  3. ONSET: "When did the COVID-19 symptoms start?"      Last Tuesday  4. WORST SYMPTOM: "What is your worst symptom?" (e.g., cough, fever, shortness of breath, muscle aches)     Cough  5. COUGH: "Do you have a cough?" If Yes, ask: "How bad is the cough?"       Yes bad  6. FEVER: "Do you have a fever?" If Yes, ask: "What is your temperature, how was it measured, and when did it start?"     No  7. RESPIRATORY STATUS: "Describe your breathing?" (e.g., shortness of breath, wheezing, unable to speak)      No  8. BETTER-SAME-WORSE: "Are you getting better, staying the same or getting worse compared to yesterday?"  If getting worse, ask, "In what way?"     better 9. HIGH RISK DISEASE: "Do you have any chronic medical  problems?" (e.g., asthma, heart or lung disease, weak immune system, obesity, etc.)     No  10. VACCINE: "Have you had the COVID-19 vaccine?" If Yes, ask: "Which one, how many shots, when did you get it?"       Yes Pfizer  11. BOOSTER: "Have you received your COVID-19 booster?" If Yes, ask: "Which one and when did you get it?"       Yes x 1 Pfizer  12. PREGNANCY: "Is there any chance you are pregnant?" "When was your last menstrual period?"       na 13. OTHER SYMPTOMS: "Do you have any other symptoms?"  (e.g., chills, fatigue, headache, loss of smell or taste, muscle pain, sore throat)       Cough  14. O2 SATURATION MONITOR:  "Do you use an oxygen saturation monitor (pulse oximeter) at home?" If Yes, ask "What is your reading (oxygen level) today?" "What is your usual oxygen saturation reading?" (e.g., 95%)       na  Protocols used: Coronavirus (COVID-19) Diagnosed or Suspected-A-AH

## 2021-03-19 ENCOUNTER — Other Ambulatory Visit: Payer: Self-pay

## 2021-03-19 ENCOUNTER — Ambulatory Visit (INDEPENDENT_AMBULATORY_CARE_PROVIDER_SITE_OTHER): Payer: Medicare HMO | Admitting: Family Medicine

## 2021-03-19 ENCOUNTER — Ambulatory Visit
Admission: RE | Admit: 2021-03-19 | Discharge: 2021-03-19 | Disposition: A | Discharge status: Home / Self Care | Payer: Medicare HMO | Attending: Family Medicine | Admitting: Family Medicine

## 2021-03-19 ENCOUNTER — Encounter: Payer: Self-pay | Admitting: Family Medicine

## 2021-03-19 ENCOUNTER — Ambulatory Visit
Admission: RE | Admit: 2021-03-19 | Discharge: 2021-03-19 | Disposition: A | Discharge status: Home / Self Care | Payer: Medicare HMO | Source: Ambulatory Visit | Attending: Family Medicine | Admitting: Family Medicine

## 2021-03-19 VITALS — BP 143/75 | HR 81 | Temp 98.6°F | Ht 63.0 in | Wt 175.6 lb

## 2021-03-19 DIAGNOSIS — M25512 Pain in left shoulder: Secondary | ICD-10-CM | POA: Diagnosis not present

## 2021-03-19 DIAGNOSIS — G8929 Other chronic pain: Secondary | ICD-10-CM | POA: Diagnosis not present

## 2021-03-19 MED ORDER — PREDNISONE 5 MG PO TABS: ORAL_TABLET | ORAL | 0 refills | Status: DC

## 2021-03-19 NOTE — Progress Notes (Signed)
Established patient visit   Patient: Beverly Bush   DOB: 1951/07/07   70 y.o. Female  MRN: [id number] Visit Date: 03/19/2021  Today's healthcare provider: Dortha Kern, PA-C   No chief complaint on file.  Subjective    HPI  Patient is having some arm pain in left arm having difficulties lifting above head. No pain when not trying to move it or just holding it down. If tried to hold over head she would rate at 9/10. Last addressed on 11/17/20. Naproxen was prescribed but did not relieve any pain so stopped taking, attempted at ibuprofen and no difference. Have not attempted any heat or ICE  Patient Active Problem List   Diagnosis Date Noted   Supraclavicular mass 11/05/2019   Osteopenia of multiple sites 08/28/2019   OSA (obstructive sleep apnea) 09/16/2016   Palpitations 05/27/2015   Allergic rhinitis 05/26/2015   Family history of diabetes mellitus 05/26/2015   Ectopic atrial beats 05/26/2015   Pre-diabetes 05/26/2015   Pure hypercholesterolemia 05/26/2015   Vitamin D deficiency 05/26/2015   Depressive disorder 04/24/2009   Urge incontinence 04/22/2008   Postprocedural state 01/30/2007   Primary hypertension 09/14/2005   Past Medical History:  Diagnosis Date   Sleep apnea   . Past Surgical History:  Procedure Laterality Date   ABDOMINAL HYSTERECTOMY  2008   Due to heavy bleeding. Complete; does not have ovaries per patient   COLONOSCOPY WITH PROPOFOL N/A 05/07/2018   Procedure: COLONOSCOPY WITH PROPOFOL;  Surgeon: Scot Jun, MD;  Location: [facility] ENDOSCOPY;  Service: Endoscopy;  Laterality: N/A;   Family History  Problem Relation Age of Onset   Diabetes Mother        non insulin dependant diabetes mellitus   Hypertension Father    Cancer Sister        breast   Breast cancer Sister    Cancer Sister        colon cancer   Breast cancer Sister    Breast cancer Paternal Aunt     No Known Allergies  Medications: Outpatient Medications Prior to  Visit  Medication Sig   benzonatate (TESSALON) 200 MG capsule Take 1 capsule (200 mg total) by mouth 2 (two) times daily as needed for cough.   Ergocalciferol (VITAMIN D2) 2000 UNITS TABS Take 1 tablet by mouth daily.   fluticasone (FLONASE) 50 MCG/ACT nasal spray SPRAY 2 SPRAYS INTO EACH NOSTRIL EVERY DAY   naproxen (NAPROSYN) 500 MG tablet Take 1 tablet (500 mg total) by mouth 2 (two) times daily with a meal. As needed for shoulder pain   nirmatrelvir/ritonavir EUA (PAXLOVID) TABS Take 3 tablets by mouth 2 (two) times daily for 5 days. (Take nirmatrelvir 150 mg two tablets twice daily for 5 days and ritonavir 100 mg one tablet twice daily for 5 days) Patient GFR is 77   valsartan-hydrochlorothiazide (DIOVAN-HCT) 160-12.5 MG tablet TAKE 1 TABLET BY MOUTH DAILY. PLEASE SCHEDULE OFFICE VISIT BEFORE ANY FUTURE REFILLS.   No facility-administered medications prior to visit.    Review of Systems  Constitutional: Negative.   HENT: Negative.    Respiratory: Negative.    Cardiovascular: Negative.   Gastrointestinal: Negative.        Objective    There were no vitals taken for this visit. BP Readings from Last 3 Encounters:  11/17/20 135/64  08/05/20 120/80  11/05/19 (!) 153/82   Wt Readings from Last 3 Encounters:  11/17/20 182 lb (82.6 kg)  08/05/20 175 lb (79.4 kg)  12/17/19 180 lb (81.6 kg)   Physical Exam Constitutional:      General: She is not in acute distress.    Appearance: She is well-developed.  HENT:     Head: Normocephalic and atraumatic.     Right Ear: Hearing normal.     Left Ear: Hearing normal.     Nose: Nose normal.  Eyes:     General: Lids are normal. No scleral icterus.       Right eye: No discharge.        Left eye: No discharge.     Conjunctiva/sclera: Conjunctivae normal.  Cardiovascular:     Rate and Rhythm: Normal rate and regular rhythm.     Pulses: Normal pulses.     Heart sounds: Normal heart sounds.  Pulmonary:     Effort: Pulmonary effort is  normal. No respiratory distress.     Breath sounds: Normal breath sounds.  Abdominal:     General: Bowel sounds are normal.     Palpations: Abdomen is soft.  Musculoskeletal:        General: Normal range of motion.     Cervical back: Neck supple.  Skin:    Findings: No lesion or rash.  Neurological:     Mental Status: She is alert and oriented to person, place, and time.  Psychiatric:        Speech: Speech normal.        Behavior: Behavior normal.        Thought Content: Thought content normal.      No results found for any visits on 03/19/21.  Assessment & Plan     1. Chronic left shoulder pain Chronic left shoulder pain without relief from use of Naproxen. No known injury. Pain to reach overhead  or elevation of the left upper arm. May apply moist heat and will get x-ray evaluation then change to prednisone taper. Recheck prn. - DG Shoulder Left - predniSONE (DELTASONE) 5 MG tablet; Taper down by 1 tablet by mouth daily starting at 6 day 1, 5 day 2, 4 day 3, 3 day 4, 2 day 5 then 1 day 6. Divide dosage among meals and bedtime.  Dispense: 21 tablet; Refill: 0   No follow-ups on file.      I, Val Farnam, PA-C, have reviewed all documentation for this visit. The documentation on 03/22/21 for the exam, diagnosis, procedures, and orders are all accurate and complete.    Dortha Kern, PA-C  [facility] [phone] (phone) [phone] (fax)  [facility]

## 2021-03-19 NOTE — Patient Instructions (Signed)
Shoulder Pain °Many things can cause shoulder pain, including: °An injury to the shoulder. °Overuse of the shoulder. °Arthritis. °The source of the pain can be: °Inflammation. °An injury to the shoulder joint. °An injury to a tendon, ligament, or bone. °Follow these instructions at home: °Pay attention to changes in your symptoms. Let your health care provider know about them. Follow these instructions to relieve your pain. °If you have a sling: °Wear the sling as told by your health care provider. Remove it only as told by your health care provider. °Loosen the sling if your fingers tingle, become numb, or turn cold and blue. °Keep the sling clean. °If the sling is not waterproof: °Do not let it get wet. Remove it to shower or bathe. °Move your arm as little as possible, but keep your hand moving to prevent swelling. °Managing pain, stiffness, and swelling ° °If directed, put ice on the painful area: °Put ice in a plastic bag. °Place a towel between your skin and the bag. °Leave the ice on for 20 minutes, 2-3 times per day. Stop applying ice if it does not help with the pain. °Squeeze a soft ball or a foam pad as much as possible. This helps to keep the shoulder from swelling. It also helps to strengthen the arm. °General instructions °Take over-the-counter and prescription medicines only as told by your health care provider. °Keep all follow-up visits as told by your health care provider. This is important. °Contact a health care provider if: °Your pain gets worse. °Your pain is not relieved with medicines. °New pain develops in your arm, hand, or fingers. °Get help right away if: °Your arm, hand, or fingers: °Tingle. °Become numb. °Become swollen. °Become painful. °Turn white or blue. °Summary °Shoulder pain can be caused by an injury, overuse, or arthritis. °Pay attention to changes in your symptoms. Let your health care provider know about them. °This condition may be treated with a sling, ice, and pain  medicines. °Contact your health care provider if the pain gets worse or new pain develops. Get help right away if your arm, hand, or fingers tingle or become numb, swollen, or painful. °Keep all follow-up visits as told by your health care provider. This is important. °This information is not intended to replace advice given to you by your health care provider. Make sure you discuss any questions you have with your health care provider. °Document Revised: 02/20/2018 Document Reviewed: 02/20/2018 °Elsevier Patient Education © 2022 [employer] ° °

## 2021-04-02 ENCOUNTER — Other Ambulatory Visit: Payer: Self-pay

## 2021-04-02 DIAGNOSIS — L089 Local infection of the skin and subcutaneous tissue, unspecified: Secondary | ICD-10-CM | POA: Diagnosis not present

## 2021-04-02 DIAGNOSIS — L723 Sebaceous cyst: Secondary | ICD-10-CM | POA: Diagnosis not present

## 2021-04-04 DIAGNOSIS — L723 Sebaceous cyst: Secondary | ICD-10-CM | POA: Diagnosis not present

## 2021-04-04 DIAGNOSIS — L089 Local infection of the skin and subcutaneous tissue, unspecified: Secondary | ICD-10-CM | POA: Diagnosis not present

## 2021-04-05 ENCOUNTER — Telehealth: Payer: Self-pay | Admitting: Family Medicine

## 2021-04-05 ENCOUNTER — Ambulatory Visit: Payer: Medicare HMO | Admitting: Family Medicine

## 2021-04-05 DIAGNOSIS — G8929 Other chronic pain: Secondary | ICD-10-CM

## 2021-04-05 MED ORDER — CELECOXIB 100 MG PO CAPS: 100.0000 mg | ORAL_CAPSULE | Freq: Two times a day (BID) | ORAL | 1 refills | Status: DC | PRN

## 2021-04-05 NOTE — Telephone Encounter (Signed)
Have placed referral for orthopedics and sent prescription of Celebrex to her pharmacy.

## 2021-04-05 NOTE — Telephone Encounter (Signed)
Pt called and stated that she would like a call back from Dr Sherrie Mustache or the nurse. Pt states that she is not able to lift her arms all the way up and would like to know if this is something that Dr Sherrie Mustache can help with. Patient would also like to discuss cpap supplies.  Please advise.

## 2021-04-05 NOTE — Telephone Encounter (Signed)
Spoke with the patient, and she reports that she has had chronic pain in her shoulder for several months. She reports that it is to the point where she can not lift her arms over her head. She is wanting to know what to do next? Per OV in 10/2020, it was recommended that she see OT. She was also seen by Dortha Kern, PA in 02/2021 for the same issue and was prescribed prednisone. She reports that it helped, but the pain has returned. She is willing to be referred, but is requesting something to help with the pain right now.   She also wanted to know if you have seen her sleep study results? She is wanting to start back on her CPAP machine, but she needs supplies. She does not want a high out of pocket expense. Would it be better to order supplies for her old machine (she has had for 5-6 years) or order a whole new machine that has the supplies with it? Please advise. Thanks!

## 2021-04-06 NOTE — Telephone Encounter (Signed)
Sleep study shows moderate to severe sleep apea. Needs order for Auto-titrating CPAP with 4-20cm pressure sent to [facility].   She'll need to schedule follow up office visit 1 month after starting new CPAP machine.

## 2021-04-07 NOTE — Telephone Encounter (Signed)
Patient advised as below. She states she had this sleep study 2 months ago and was already advised that she had sleep apnea. Patient says she hasn't heard from the company since then about an update on her CPAP machine. Patient currently has a CPAP machine, but the parts need to be replaced because they no longer work properly. Patient wants our office to call the CPAP company to get an update. Patient thinks the company that called her was [employer] in [place] [phone]. I advised patient that we would check on the CPAP order on the next business day since it is after 5pm today. Patient verbalized understanding.

## 2021-04-09 NOTE — Telephone Encounter (Signed)
I called [employer] and was advised that they did receive our order for CPAP and the settings a few weeks ago. I was then advised that there is a Sport and exercise psychologist on CPAP machines and supplies. The [employer] associate I spoke with says that she cant give a time frame but most patients are having to wait 6 months for a machine. I called and spoke with patient and gave her this update. Patient verbalized understanding.

## 2021-04-21 DIAGNOSIS — M7542 Impingement syndrome of left shoulder: Secondary | ICD-10-CM | POA: Diagnosis not present

## 2021-05-12 DIAGNOSIS — M7542 Impingement syndrome of left shoulder: Secondary | ICD-10-CM | POA: Insufficient documentation

## 2021-05-19 DIAGNOSIS — M7542 Impingement syndrome of left shoulder: Secondary | ICD-10-CM | POA: Diagnosis not present

## 2021-05-26 DIAGNOSIS — M7542 Impingement syndrome of left shoulder: Secondary | ICD-10-CM | POA: Diagnosis not present

## 2021-05-31 DIAGNOSIS — M7542 Impingement syndrome of left shoulder: Secondary | ICD-10-CM | POA: Diagnosis not present

## 2021-05-31 DIAGNOSIS — M7502 Adhesive capsulitis of left shoulder: Secondary | ICD-10-CM | POA: Diagnosis not present

## 2021-06-09 DIAGNOSIS — M25512 Pain in left shoulder: Secondary | ICD-10-CM | POA: Diagnosis not present

## 2021-06-30 DIAGNOSIS — M7542 Impingement syndrome of left shoulder: Secondary | ICD-10-CM | POA: Diagnosis not present

## 2021-07-06 DIAGNOSIS — M7542 Impingement syndrome of left shoulder: Secondary | ICD-10-CM | POA: Diagnosis not present

## 2021-07-13 ENCOUNTER — Other Ambulatory Visit: Payer: Self-pay | Admitting: Family Medicine

## 2021-07-13 DIAGNOSIS — Z1231 Encounter for screening mammogram for malignant neoplasm of breast: Secondary | ICD-10-CM

## 2021-07-14 NOTE — Telephone Encounter (Signed)
Pt following up on CPAP machine that was ordered back in June.  Pt wants to know if you can follow up on that?

## 2021-07-21 ENCOUNTER — Ambulatory Visit (INDEPENDENT_AMBULATORY_CARE_PROVIDER_SITE_OTHER): Payer: Medicare HMO

## 2021-07-21 ENCOUNTER — Other Ambulatory Visit: Payer: Self-pay

## 2021-07-21 DIAGNOSIS — Z23 Encounter for immunization: Secondary | ICD-10-CM | POA: Diagnosis not present

## 2021-08-11 ENCOUNTER — Ambulatory Visit
Admission: RE | Admit: 2021-08-11 | Discharge: 2021-08-11 | Disposition: A | Discharge status: Home / Self Care | Payer: Medicare HMO | Source: Ambulatory Visit | Attending: Family Medicine | Admitting: Family Medicine

## 2021-08-11 ENCOUNTER — Other Ambulatory Visit: Payer: Self-pay

## 2021-08-11 DIAGNOSIS — Z1231 Encounter for screening mammogram for malignant neoplasm of breast: Secondary | ICD-10-CM | POA: Insufficient documentation

## 2021-08-19 NOTE — Progress Notes (Deleted)
Established patient visit   Patient: Beverly Bush   DOB: 1951/03/01   70 y.o. Female  MRN: [id number] Visit Date: 08/20/2021  Today's healthcare provider: Lelon Huh, MD   No chief complaint on file.  Subjective    HPI  Hypertension, follow-up  BP Readings from Last 3 Encounters:  03/19/21 (!) 143/75  11/17/20 135/64  08/05/20 120/80   Wt Readings from Last 3 Encounters:  03/19/21 175 lb 9.6 oz (79.7 kg)  11/17/20 182 lb (82.6 kg)  08/05/20 175 lb (79.4 kg)     She was last seen for hypertension 9 months ago.  BP at that visit was 135/64. Management since that visit includes continue current treatment.  She reports {excellent/good/fair/poor:19665} compliance with treatment. She {is/is not:9024} having side effects. {document side effects if present:1} She is following a {diet:21022986} diet. She {is/is not:9024} exercising. She {does/does not:200015} smoke.  Use of agents associated with hypertension: {bp agents assoc with hypertension:511::"none"}.   Outside blood pressures are {***enter patient reported home BP readings, or 'not being checked':1}. Symptoms: {Yes/No:20286} chest pain {Yes/No:20286} chest pressure  {Yes/No:20286} palpitations {Yes/No:20286} syncope  {Yes/No:20286} dyspnea {Yes/No:20286} orthopnea  {Yes/No:20286} paroxysmal nocturnal dyspnea {Yes/No:20286} lower extremity edema   Pertinent labs: Lab Results  Component Value Date   CHOL 218 (H) 11/19/2020   HDL 78 11/19/2020   LDLCALC 131 (H) 11/19/2020   TRIG 54 11/19/2020   CHOLHDL 2.8 11/19/2020   Lab Results  Component Value Date   NA 147 (H) 11/19/2020   K 4.6 11/19/2020   CREATININE 0.83 11/19/2020   EGFR 77 11/19/2020   GLUCOSE 100 (H) 11/19/2020   TSH 3.160 06/29/2016     The 10-year ASCVD risk score (Arnett DK, et al., 2019) is: 14.8%   ---------------------------------------------------------------------------------------------------   Medications: Outpatient  Medications Prior to Visit  Medication Sig   benzonatate (TESSALON) 200 MG capsule Take 1 capsule (200 mg total) by mouth 2 (two) times daily as needed for cough.   celecoxib (CELEBREX) 100 MG capsule Take 1 capsule (100 mg total) by mouth 2 (two) times daily as needed.   Ergocalciferol (VITAMIN D2) 2000 UNITS TABS Take 1 tablet by mouth daily.   fluticasone (FLONASE) 50 MCG/ACT nasal spray SPRAY 2 SPRAYS INTO EACH NOSTRIL EVERY DAY   predniSONE (DELTASONE) 5 MG tablet Taper down by 1 tablet by mouth daily starting at 6 day 1, 5 day 2, 4 day 3, 3 day 4, 2 day 5 then 1 day 6. Divide dosage among meals and bedtime.   valsartan-hydrochlorothiazide (DIOVAN-HCT) 160-12.5 MG tablet TAKE 1 TABLET BY MOUTH DAILY. PLEASE SCHEDULE OFFICE VISIT BEFORE ANY FUTURE REFILLS.   No facility-administered medications prior to visit.    Review of Systems  {Labs   Heme   Chem   Endocrine   Serology   Results Review (optional):23779}   Objective    There were no vitals taken for this visit. {Show previous vital signs (optional):23777}  Physical Exam  ***  No results found for any visits on 08/20/21.  Assessment & Plan     ***  No follow-ups on file.      {provider attestation***:1}   Lelon Huh, MD  [facility] [phone] (phone) [phone] (fax)  [place]

## 2021-08-20 ENCOUNTER — Ambulatory Visit: Payer: Medicare HMO | Admitting: Family Medicine

## 2021-10-20 ENCOUNTER — Ambulatory Visit: Payer: Medicare HMO | Admitting: Obstetrics & Gynecology

## 2021-11-10 ENCOUNTER — Ambulatory Visit (INDEPENDENT_AMBULATORY_CARE_PROVIDER_SITE_OTHER): Payer: Medicare HMO

## 2021-11-10 VITALS — Ht 63.0 in | Wt 175.0 lb

## 2021-11-10 DIAGNOSIS — Z0000 Encounter for general adult medical examination without abnormal findings: Secondary | ICD-10-CM | POA: Diagnosis not present

## 2021-11-10 NOTE — Progress Notes (Signed)
?Virtual Visit via Telephone Note ? ?I connected with  Orlean Pattenoris A Chew on 11/10/21 at 11:00 AM EDT by telephone and verified that I am speaking with the correct person using two identifiers. ? ?Location: ?Patient: home ?Provider: [facility] ?Persons participating in the virtual visit: patient/Nurse Health Advisor ?  ?I discussed the limitations, risks, security and privacy concerns of performing an evaluation and management service by telephone and the availability of in person appointments. The patient expressed understanding and agreed to proceed. ? ?Interactive audio and video telecommunications were attempted between this nurse and patient, however failed, due to patient having technical difficulties OR patient did not have access to video capability.  We continued and completed visit with audio only. ? ?Some vital signs may be absent or patient reported.  ? ?Hal HopeLorrie S Aliscia Lynde, LPN ? ?Subjective:  ? Orlean PattenDoris A Housman is a 71 y.o. female who presents for Medicare Annual (Subsequent) preventive examination. ? ?Review of Systems    ? ?  ? ?   ?Objective:  ?  ?Today's Vitals  ? 11/10/21 1101  ?Weight: 175 lb (79.4 kg)  ?Height: 5\' 3"  (1.6 m)  ? ?Body mass index is 31 kg/m?. ? ? ?  05/07/2018  ?  8:11 AM  ?Advanced Directives  ?Does Patient Have a Medical Advance Directive? No  ? ? ?Current Medications (verified) ?Outpatient Encounter Medications as of 11/10/2021  ?Medication Sig  ? fluticasone (FLONASE) 50 MCG/ACT nasal spray SPRAY 2 SPRAYS INTO EACH NOSTRIL EVERY DAY  ? telmisartan-hydrochlorothiazide (MICARDIS HCT) 40-12.5 MG tablet Take 1 tablet by mouth daily.  ? triamcinolone (KENALOG) 0.025 % cream Apply for rash on neck twice daily as needed.  ? benzonatate (TESSALON) 200 MG capsule Take 1 capsule (200 mg total) by mouth 2 (two) times daily as needed for cough.  ? celecoxib (CELEBREX) 100 MG capsule Take 1 capsule (100 mg total) by mouth 2 (two) times daily as needed.  ? Cholecalciferol 50 MCG (2000 UT) CAPS Take by  mouth.  ? doxycycline (VIBRA-TABS) 100 MG tablet doxycycline hyclate 100 mg tablet ? TAKE 1 TABLET BY MOUTH TWICE A DAY FOR 10 DAYS  ? Ergocalciferol (VITAMIN D2) 2000 UNITS TABS Take 1 tablet by mouth daily.  ? fluticasone (FLONASE) 50 MCG/ACT nasal spray SPRAY 2 SPRAYS INTO EACH NOSTRIL EVERY DAY  ? Multiple Vitamin (MULTIVITAMIN ADULT PO) Take 1 tablet by mouth daily.  ? naproxen (NAPROSYN) 500 MG tablet naproxen 500 mg tablet ? TAKE 1 TABLET BY MOUTH 2 TIMES DAILY WITH A MEAL.  ? nirmatrelvir & ritonavir (PAXLOVID, 300/100,) 20 x 150 MG & 10 x 100MG  TBPK Paxlovid 300 mg (150 mg x 2)-100 mg tablets in a dose pack (EUA) ? FOLLOW INSTRUCTIONS ON FRONT OF PACKAGE  ? predniSONE (DELTASONE) 5 MG tablet Taper down by 1 tablet by mouth daily starting at 6 day 1, 5 day 2, 4 day 3, 3 day 4, 2 day 5 then 1 day 6. Divide dosage among meals and bedtime.  ? traMADol (ULTRAM) 50 MG tablet tramadol 50 mg tablet ? TAKE 1 TABLET BY MOUTH EVERY 6 HOURS AS NEEDED FOR UP TO 10 DAYS.  ? valsartan-hydrochlorothiazide (DIOVAN-HCT) 160-12.5 MG tablet TAKE 1 TABLET BY MOUTH DAILY. PLEASE SCHEDULE OFFICE VISIT BEFORE ANY FUTURE REFILLS.  ? ?No facility-administered encounter medications on file as of 11/10/2021.  ? ? ?Allergies (verified) ?Patient has no known allergies.  ? ?History: ?Past Medical History:  ?Diagnosis Date  ? Sleep apnea   ? ?Past Surgical History:  ?  Procedure Laterality Date  ? ABDOMINAL HYSTERECTOMY  2008  ? Due to heavy bleeding. Complete; does not have ovaries per patient  ? COLONOSCOPY WITH PROPOFOL N/A 05/07/2018  ? Procedure: COLONOSCOPY WITH PROPOFOL;  Surgeon: Scot Jun, MD;  Location: [facility] ENDOSCOPY;  Service: Endoscopy;  Laterality: N/A;  ? ?Family History  ?Problem Relation Age of Onset  ? Diabetes Mother   ?     non insulin dependant diabetes mellitus  ? Hypertension Father   ? Cancer Sister   ?     breast  ? Breast cancer Sister   ? Cancer Sister   ?     colon cancer  ? Breast cancer Sister   ? Breast  cancer Paternal Aunt   ? ?Social History  ? ?Socioeconomic History  ? Marital status: Married  ?  Spouse name: Not on file  ? Number of children: 2  ? Years of education: College Gr  ? Highest education level: Not on file  ?Occupational History  ? Not on file  ?Tobacco Use  ? Smoking status: Never  ? Smokeless tobacco: Never  ?Vaping Use  ? Vaping Use: Not on file  ?Substance and Sexual Activity  ? Alcohol use: No  ? Drug use: No  ? Sexual activity: Not Currently  ?Other Topics Concern  ? Not on file  ?Social History Narrative  ? Not on file  ? ?Social Determinants of Health  ? ?Financial Resource Strain: Not on file  ?Food Insecurity: Not on file  ?Transportation Needs: Not on file  ?Physical Activity: Not on file  ?Stress: Not on file  ?Social Connections: Not on file  ? ? ?Tobacco Counseling ?Counseling given: Not Answered ? ? ?Clinical Intake: ? ?Pre-visit preparation completed: Yes ? ?Pain : No/denies pain ? ?  ? ?Diabetes: No ? ?How often do you need to have someone help you when you read instructions, pamphlets, or other written materials from your doctor or pharmacy?: 1 - Never ? ?Diabetic?no ? ?Interpreter Needed?: No ? ?Information entered by :: Kennedy Bucker, LPN ? ? ?Activities of Daily Living ? ?  03/19/2021  ?  9:36 AM 11/17/2020  ?  3:05 PM  ?In your present state of health, do you have any difficulty performing the following activities:  ?Hearing? 0 0  ?Vision? 0 0  ?Difficulty concentrating or making decisions? 0 0  ?Walking or climbing stairs? 0 0  ?Dressing or bathing? 0 0  ?Doing errands, shopping? 0 0  ? ? ?Patient Care Team: ?Malva Limes, MD as PCP - General (Family Medicine) ?[facility] ? ?Indicate any recent Medical Services you may have received from other than [facility] providers in the past year (date may be approximate). ? ?   ?Assessment:  ? This is a routine wellness examination for Radiah. ? ?Hearing/Vision screen ?No results found. ? ?Dietary issues and exercise  activities discussed: ?  ? ? Goals Addressed   ?None ?  ? ?Depression Screen ? ?  03/19/2021  ?  9:36 AM 11/17/2020  ?  3:04 PM 08/28/2019  ?  9:20 AM 04/09/2018  ?  2:13 PM 06/28/2016  ?  3:29 PM  ?PHQ 2/9 Scores  ?PHQ - 2 Score 1 0 0 0 1  ?PHQ- 9 Score 2 4 0 2 3  ?  ?Fall Risk ? ?  03/19/2021  ?  9:35 AM 11/17/2020  ?  3:03 PM 08/28/2019  ?  9:20 AM  ?Fall Risk   ?Falls in  the past year? 0 0 0  ?Number falls in past yr: 0 0 0  ?Injury with Fall? 0 0 0  ?Risk for fall due to : No Fall Risks    ?Follow up   Falls evaluation completed  ? ? ?FALL RISK PREVENTION PERTAINING TO THE HOME: ? ?Any stairs in or around the home? No  ?If so, are there any without handrails? No  ?Home free of loose throw rugs in walkways, pet beds, electrical cords, etc? Yes  ?Adequate lighting in your home to reduce risk of falls? Yes  ? ?ASSISTIVE DEVICES UTILIZED TO PREVENT FALLS: ? ?Life alert? No  ?Use of a cane, walker or w/c? No  ?Grab bars in the bathroom? No  ?Shower chair or bench in shower? No  ?Elevated toilet seat or a handicapped toilet? No  ? ? ?Cognitive Function:Normal cognitive status assessed by direct observation by this Nurse Health Advisor. No abnormalities found.  ? ?  ?  ?  ? ?Immunizations ?Immunization History  ?Administered Date(s) Administered  ? Fluad Quad(high Dose 65+) 07/21/2021  ? Influenza, High Dose Seasonal PF 06/13/2018, 08/25/2020  ? Influenza,inj,Quad PF,6+ Mos 05/27/2015, 06/28/2016, 07/23/2019  ? PFIZER Comirnaty(Gray Top)Covid-19 Tri-Sucrose Vaccine 04/21/2021  ? PFIZER(Purple Top)SARS-COV-2 Vaccination 09/02/2019, 09/23/2019, 08/10/2020  ? Pneumococcal Polysaccharide-23 12/17/2019  ? Td 04/09/2018  ? Tdap 04/22/2008  ? Zoster Recombinat (Shingrix) 04/09/2018, 06/13/2018  ? Zoster, Live 09/23/2014  ? ? ?TDAP status: Up to date ? ?Flu Vaccine status: Up to date ? ?Pneumococcal vaccine status: Up to date ? ?Covid-19 vaccine status: Completed vaccines ? ?Qualifies for Shingles Vaccine? Yes   ?Zostavax completed Yes    ?Shingrix Completed?: Yes ? ?Screening Tests ?Health Maintenance  ?Topic Date Due  ? Pneumonia Vaccine 39+ Years old (2 - PCV) 12/16/2020  ? COVID-19 Vaccine (5 - Booster for Pfizer series) 06/16/2021  ? DEXA S

## 2021-11-10 NOTE — Patient Instructions (Addendum)
Ms. Duba , ?Thank you for taking time to come for your Medicare Wellness Visit. I appreciate your ongoing commitment to your health goals. Please review the following plan we discussed and let me know if I can assist you in the future.  ? ?Screening recommendations/referrals: ?Colonoscopy: 05/07/18 ?Mammogram: 08/11/21 ?Bone Density: 08/16/18 ?Recommended yearly ophthalmology/optometry visit for glaucoma screening and checkup ?Recommended yearly dental visit for hygiene and checkup ? ?Vaccinations: ?Influenza vaccine: 07/21/21 ?Pneumococcal vaccine: 12/17/19 ?Tdap vaccine: 04/09/18 ?Shingles vaccine: Shingrix  04/09/18, 06/13/18 ?Covid-19:09/02/19, 09/23/19, 08/10/20, 04/21/21 ? ?Advanced directives: no ? ?Conditions/risks identified: none ? ?Next appointment: Follow up in one year for your annual wellness visit - 11/15/22 @ 9:30am by phone ? ? ?Preventive Care 11 Years and Older, Female ?Preventive care refers to lifestyle choices and visits with your health care provider that can promote health and wellness. ?What does preventive care include? ?A yearly physical exam. This is also called an annual well check. ?Dental exams once or twice a year. ?Routine eye exams. Ask your health care provider how often you should have your eyes checked. ?Personal lifestyle choices, including: ?Daily care of your teeth and gums. ?Regular physical activity. ?Eating a healthy diet. ?Avoiding tobacco and drug use. ?Limiting alcohol use. ?Practicing safe sex. ?Taking low-dose aspirin every day. ?Taking vitamin and mineral supplements as recommended by your health care provider. ?What happens during an annual well check? ?The services and screenings done by your health care provider during your annual well check will depend on your age, overall health, lifestyle risk factors, and family history of disease. ?Counseling  ?Your health care provider may ask you questions about your: ?Alcohol use. ?Tobacco use. ?Drug use. ?Emotional  well-being. ?Home and relationship well-being. ?Sexual activity. ?Eating habits. ?History of falls. ?Memory and ability to understand (cognition). ?Work and work Astronomer. ?Reproductive health. ?Screening  ?You may have the following tests or measurements: ?Height, weight, and BMI. ?Blood pressure. ?Lipid and cholesterol levels. These may be checked every 5 years, or more frequently if you are over 19 years old. ?Skin check. ?Lung cancer screening. You may have this screening every year starting at age 35 if you have a 30-pack-year history of smoking and currently smoke or have quit within the past 15 years. ?Fecal occult blood test (FOBT) of the stool. You may have this test every year starting at age 35. ?Flexible sigmoidoscopy or colonoscopy. You may have a sigmoidoscopy every 5 years or a colonoscopy every 10 years starting at age 42. ?Hepatitis C blood test. ?Hepatitis B blood test. ?Sexually transmitted disease (STD) testing. ?Diabetes screening. This is done by checking your blood sugar (glucose) after you have not eaten for a while (fasting). You may have this done every 1-3 years. ?Bone density scan. This is done to screen for osteoporosis. You may have this done starting at age 55. ?Mammogram. This may be done every 1-2 years. Talk to your health care provider about how often you should have regular mammograms. ?Talk with your health care provider about your test results, treatment options, and if necessary, the need for more tests. ?Vaccines  ?Your health care provider may recommend certain vaccines, such as: ?Influenza vaccine. This is recommended every year. ?Tetanus, diphtheria, and acellular pertussis (Tdap, Td) vaccine. You may need a Td booster every 10 years. ?Zoster vaccine. You may need this after age 19. ?Pneumococcal 13-valent conjugate (PCV13) vaccine. One dose is recommended after age 54. ?Pneumococcal polysaccharide (PPSV23) vaccine. One dose is recommended after age 59. ?Talk to your  health care provider about which screenings and vaccines you need and how often you need them. ?This information is not intended to replace advice given to you by your health care provider. Make sure you discuss any questions you have with your health care provider. ?Document Released: 09/04/2015 Document Revised: 04/27/2016 Document Reviewed: 06/09/2015 ?Elsevier Interactive Patient Education ? 2017 [employer] ? ?Fall Prevention in the Home ?Falls can cause injuries. They can happen to people of all ages. There are many things you can do to make your home safe and to help prevent falls. ?What can I do on the outside of my home? ?Regularly fix the edges of walkways and driveways and fix any cracks. ?Remove anything that might make you trip as you walk through a door, such as a raised step or threshold. ?Trim any bushes or trees on the path to your home. ?Use bright outdoor lighting. ?Clear any walking paths of anything that might make someone trip, such as rocks or tools. ?Regularly check to see if handrails are loose or broken. Make sure that both sides of any steps have handrails. ?Any raised decks and porches should have guardrails on the edges. ?Have any leaves, snow, or ice cleared regularly. ?Use sand or salt on walking paths during winter. ?Clean up any spills in your garage right away. This includes oil or grease spills. ?What can I do in the bathroom? ?Use night lights. ?Install grab bars by the toilet and in the tub and shower. Do not use towel bars as grab bars. ?Use non-skid mats or decals in the tub or shower. ?If you need to sit down in the shower, use a plastic, non-slip stool. ?Keep the floor dry. Clean up any water that spills on the floor as soon as it happens. ?Remove soap buildup in the tub or shower regularly. ?Attach bath mats securely with double-sided non-slip rug tape. ?Do not have throw rugs and other things on the floor that can make you trip. ?What can I do in the bedroom? ?Use night  lights. ?Make sure that you have a light by your bed that is easy to reach. ?Do not use any sheets or blankets that are too big for your bed. They should not hang down onto the floor. ?Have a firm chair that has side arms. You can use this for support while you get dressed. ?Do not have throw rugs and other things on the floor that can make you trip. ?What can I do in the kitchen? ?Clean up any spills right away. ?Avoid walking on wet floors. ?Keep items that you use a lot in easy-to-reach places. ?If you need to reach something above you, use a strong step stool that has a grab bar. ?Keep electrical cords out of the way. ?Do not use floor polish or wax that makes floors slippery. If you must use wax, use non-skid floor wax. ?Do not have throw rugs and other things on the floor that can make you trip. ?What can I do with my stairs? ?Do not leave any items on the stairs. ?Make sure that there are handrails on both sides of the stairs and use them. Fix handrails that are broken or loose. Make sure that handrails are as long as the stairways. ?Check any carpeting to make sure that it is firmly attached to the stairs. Fix any carpet that is loose or worn. ?Avoid having throw rugs at the top or bottom of the stairs. If you do have throw rugs, attach them to  the floor with carpet tape. ?Make sure that you have a light switch at the top of the stairs and the bottom of the stairs. If you do not have them, ask someone to add them for you. ?What else can I do to help prevent falls? ?Wear shoes that: ?Do not have high heels. ?Have rubber bottoms. ?Are comfortable and fit you well. ?Are closed at the toe. Do not wear sandals. ?If you use a stepladder: ?Make sure that it is fully opened. Do not climb a closed stepladder. ?Make sure that both sides of the stepladder are locked into place. ?Ask someone to hold it for you, if possible. ?Clearly mark and make sure that you can see: ?Any grab bars or handrails. ?First and last  steps. ?Where the edge of each step is. ?Use tools that help you move around (mobility aids) if they are needed. These include: ?Canes. ?Walkers. ?Scooters. ?Crutches. ?Turn on the lights when you go into a dark area. [place]

## 2021-11-22 ENCOUNTER — Encounter: Payer: Self-pay | Admitting: Family Medicine

## 2021-11-22 ENCOUNTER — Ambulatory Visit (INDEPENDENT_AMBULATORY_CARE_PROVIDER_SITE_OTHER): Payer: Medicare HMO | Admitting: Family Medicine

## 2021-11-22 VITALS — BP 153/79 | HR 73 | Temp 97.6°F | Resp 16 | Ht 62.0 in | Wt 177.0 lb

## 2021-11-22 DIAGNOSIS — E559 Vitamin D deficiency, unspecified: Secondary | ICD-10-CM

## 2021-11-22 DIAGNOSIS — E2839 Other primary ovarian failure: Secondary | ICD-10-CM

## 2021-11-22 DIAGNOSIS — M791 Myalgia, unspecified site: Secondary | ICD-10-CM | POA: Diagnosis not present

## 2021-11-22 DIAGNOSIS — I10 Essential (primary) hypertension: Secondary | ICD-10-CM | POA: Diagnosis not present

## 2021-11-22 DIAGNOSIS — R7303 Prediabetes: Secondary | ICD-10-CM

## 2021-11-22 DIAGNOSIS — E7800 Pure hypercholesterolemia, unspecified: Secondary | ICD-10-CM

## 2021-11-22 MED ORDER — VALSARTAN-HYDROCHLOROTHIAZIDE 160-25 MG PO TABS: 1.0000 | ORAL_TABLET | Freq: Every day | ORAL | 1 refills | Status: DC

## 2021-11-22 NOTE — Progress Notes (Signed)
?  ? ? ?Established patient visit ? ?I,April Miller,acting as a scribe for Lelon Huh, MD.,have documented all relevant documentation on the behalf of Lelon Huh, MD,as directed by  Lelon Huh, MD while in the presence of Lelon Huh, MD. ? ? ?Patient: Beverly Bush   DOB: 1951/05/13   71 y.o. Female  MRN: [id number] ?Visit Date: 11/22/2021 ? ?Today's healthcare provider: Lelon Huh, MD  ? ?Chief Complaint  ?Patient presents with  ? Follow-up  ? Hypertension  ? Hyperlipidemia  ? Prediabetes  ? ?Subjective  ?  ?HPI  ?Hypertension, follow-up ? ?BP Readings from Last 3 Encounters:  ?11/22/21 (!) 153/79  ?03/19/21 (!) 143/75  ?11/17/20 135/64  ? Wt Readings from Last 3 Encounters:  ?11/22/21 177 lb (80.3 kg)  ?11/10/21 175 lb (79.4 kg)  ?03/19/21 175 lb 9.6 oz (79.7 kg)  ?  ? ?She was last seen for hypertension 1 years ago.  ?BP at that visit was 135/64. ?Management since that visit includes Continue same medications.  ?She reports good compliance with treatment. ?She is not having side effects. none ?She is exercising. ?She is adherent to low salt diet.   ?Outside blood pressures are checks occasionally, usually normal. ? ?She does not smoke. ? ?Use of agents associated with hypertension: none.  ? ?--------------------------------------------------------------------------------------------------- ?Lipid/Cholesterol, follow-up ? ?Last Lipid Panel: ?Lab Results  ?Component Value Date  ? CHOL 218 (H) 11/19/2020  ? LDLCALC 131 (H) 11/19/2020  ? HDL 78 11/19/2020  ? TRIG 54 11/19/2020  ? ? ?She was last seen for this 1 years ago.  ?Management since that visit includes; labs checked-advised diet and exercise. ? ?She reports good compliance with treatment. ?She is not having side effects. none ? ?She is following a Regular, Low Sodium diet. ?Current exercise: walking ? ?Last metabolic panel ?Lab Results  ?Component Value Date  ? GLUCOSE 100 (H) 11/19/2020  ? NA 147 (H) 11/19/2020  ? K 4.6 11/19/2020  ? BUN 14  11/19/2020  ? CREATININE 0.83 11/19/2020  ? EGFR 77 11/19/2020  ? GFRNONAA 67 08/28/2019  ? CALCIUM 9.1 11/19/2020  ? AST 17 11/19/2020  ? ALT 16 11/19/2020  ? ?The 10-year ASCVD risk score (Arnett DK, et al., 2019) is: 16.4% ? ?--------------------------------------------------------------------------------------------------- ?Prediabetes, Follow-up ? ?Lab Results  ?Component Value Date  ? HGBA1C 6.0 (H) 11/19/2020  ? HGBA1C 5.8 (H) 08/28/2019  ? HGBA1C 6.0 (H) 04/12/2018  ? GLUCOSE 100 (H) 11/19/2020  ? GLUCOSE 99 08/28/2019  ? GLUCOSE 125 (H) 04/12/2018  ? ? ?Last seen for for this1 years ago.  ?Management since that visit includes; labs checked-advised diet and exercise. ?Current symptoms include none and have been unchanged. ? ?Prior visit with dietician: no ?Current diet: well balanced ?Current exercise: walking ?----------------------------------------------------------------------------------------- ? ? ?Medications: ?Outpatient Medications Prior to Visit  ?Medication Sig  ? Cholecalciferol 50 MCG (2000 UT) CAPS Take by mouth.  ? fluticasone (FLONASE) 50 MCG/ACT nasal spray SPRAY 2 SPRAYS INTO EACH NOSTRIL EVERY DAY  ? Multiple Vitamin (MULTIVITAMIN ADULT PO) Take 1 tablet by mouth daily.  ? valsartan-hydrochlorothiazide (DIOVAN-HCT) 160-12.5 MG tablet TAKE 1 TABLET BY MOUTH DAILY. PLEASE SCHEDULE OFFICE VISIT BEFORE ANY FUTURE REFILLS.  ? celecoxib (CELEBREX) 100 MG capsule Take 1 capsule (100 mg total) by mouth 2 (two) times daily as needed. (Patient not taking: Reported on 11/10/2021)  ? Ergocalciferol (VITAMIN D2) 2000 UNITS TABS Take 1 tablet by mouth daily. (Patient not taking: Reported on 11/10/2021)  ? traMADol (ULTRAM) 50 MG tablet tramadol  50 mg tablet ? TAKE 1 TABLET BY MOUTH EVERY 6 HOURS AS NEEDED FOR UP TO 10 DAYS. (Patient not taking: Reported on 11/10/2021)  ? triamcinolone (KENALOG) 0.025 % cream Apply for rash on neck twice daily as needed. (Patient not taking: Reported on 11/22/2021)  ? ?No  facility-administered medications prior to visit.  ? ? ?Review of Systems  ?Constitutional:  Negative for appetite change, chills, fatigue and fever.  ?Respiratory:  Negative for chest tightness and shortness of breath.   ?Cardiovascular:  Negative for chest pain and palpitations.  ?Gastrointestinal:  Negative for abdominal pain, nausea and vomiting.  ?Neurological:  Negative for dizziness and weakness.  ? ? ?  Objective  ?  ?BP (!) 153/79 (BP Location: Right Arm, Patient Position: Sitting, Cuff Size: Normal)   Pulse 73   Temp 97.6 ?F (36.4 ?C) (Temporal)   Resp 16   Ht 5' 2"  (1.575 m)   Wt 177 lb (80.3 kg)   SpO2 95%   BMI 32.37 kg/m?  ? ? ?Physical Exam  ? ?General: Appearance:    Mildly obese female in no acute distress  ?Eyes:    PERRL, conjunctiva/corneas clear, EOM's intact       ?Lungs:     Clear to auscultation bilaterally, respirations unlabored  ?Heart:    Normal heart rate. Normal rhythm. No murmurs, rubs, or gallops.    ?MS:   All extremities are intact.    ?Neurologic:   Awake, alert, oriented x 3. No apparent focal neurological defect.   ?   ?  ? Assessment & Plan  ?  ? ?1. Primary hypertension ?Not at goal.  ?- TSH ?- CBC w/Diff/Platelet ?- Comprehensive Metabolic Panel (CMET) ? ?Increase to  valsartan-hydrochlorothiazide (DIOVAN-HCT) 160-25 MG tablet; Take 1 tablet by mouth daily.  Dispense: 90 tablet; Refill: 1 ?Encourage physical activity/regular exercise.  ? ?2. Pure hypercholesterolemia ?Currently diet controlled.  ?- Lipid panel ? ?3. Pre-diabetes ? ?- Hemoglobin A1c ? ?4. Estrogen deficiency ? ?- DG Bone density [place]; Future ? ?5. Vitamin D deficiency ? ?- VITAMIN D 25 Hydroxy (Vit-D Deficiency, Fractures) ? ?6. Myalgia ?Predominantly in her shoulders, but also affects arms, neck and back.  ?- Sed Rate (ESR) ?- CK (Creatine Kinase)  ?   ? ?The entirety of the information documented in the History of Present Illness, Review of Systems and Physical Exam were personally obtained by me.  Portions of this information were initially documented by the CMA and reviewed by me for thoroughness and accuracy.   ? ? ?Lelon Huh, MD  ?[facility] ?[phone] (phone) ?[phone] (fax) ? ?[facility] ?

## 2021-11-23 DIAGNOSIS — I10 Essential (primary) hypertension: Secondary | ICD-10-CM | POA: Diagnosis not present

## 2021-11-23 DIAGNOSIS — E559 Vitamin D deficiency, unspecified: Secondary | ICD-10-CM | POA: Diagnosis not present

## 2021-11-23 DIAGNOSIS — M791 Myalgia, unspecified site: Secondary | ICD-10-CM | POA: Diagnosis not present

## 2021-11-23 DIAGNOSIS — E7800 Pure hypercholesterolemia, unspecified: Secondary | ICD-10-CM | POA: Diagnosis not present

## 2021-11-23 DIAGNOSIS — R7303 Prediabetes: Secondary | ICD-10-CM | POA: Diagnosis not present

## 2021-11-24 LAB — LIPID PANEL
Chol/HDL Ratio: 2.5 ratio (ref 0.0–4.4)
Cholesterol, Total: 221 mg/dL — ABNORMAL HIGH (ref 100–199)
HDL: 88 mg/dL (ref >39)
LDL Chol Calc (NIH): 122 mg/dL — ABNORMAL HIGH (ref 0–99)
Triglycerides: 66 mg/dL (ref 0–149)
VLDL Cholesterol Cal: 11 mg/dL (ref 5–40)

## 2021-11-24 LAB — COMPREHENSIVE METABOLIC PANEL
ALT: 19 IU/L (ref 0–32)
AST: 18 IU/L (ref 0–40)
Albumin/Globulin Ratio: 1.4 (ref 1.2–2.2)
Albumin: 4.1 g/dL (ref 3.8–4.8)
Alkaline Phosphatase: 74 IU/L (ref 44–121)
BUN/Creatinine Ratio: 17 (ref 12–28)
BUN: 14 mg/dL (ref 8–27)
Bilirubin Total: 0.7 mg/dL (ref 0.0–1.2)
CO2: 25 mmol/L (ref 20–29)
Calcium: 9.8 mg/dL (ref 8.7–10.3)
Chloride: 106 mmol/L (ref 96–106)
Creatinine, Ser: 0.81 mg/dL (ref 0.57–1.00)
Globulin, Total: 2.9 g/dL (ref 1.5–4.5)
Glucose: 114 mg/dL — ABNORMAL HIGH (ref 70–99)
Potassium: 4.3 mmol/L (ref 3.5–5.2)
Sodium: 145 mmol/L — ABNORMAL HIGH (ref 134–144)
Total Protein: 7 g/dL (ref 6.0–8.5)
eGFR: 79 mL/min/{1.73_m2} (ref >59)

## 2021-11-24 LAB — CBC WITH DIFFERENTIAL/PLATELET
Basophils Absolute: 0 10*3/uL (ref 0.0–0.2)
Basos: 1 %
EOS (ABSOLUTE): 0.3 10*3/uL (ref 0.0–0.4)
Eos: 5 %
Hematocrit: 37.1 % (ref 34.0–46.6)
Hemoglobin: 12.5 g/dL (ref 11.1–15.9)
Immature Grans (Abs): 0 10*3/uL (ref 0.0–0.1)
Immature Granulocytes: 0 %
Lymphocytes Absolute: 2.2 10*3/uL (ref 0.7–3.1)
Lymphs: 39 %
MCH: 29.4 pg (ref 26.6–33.0)
MCHC: 33.7 g/dL (ref 31.5–35.7)
MCV: 87 fL (ref 79–97)
Monocytes Absolute: 0.6 10*3/uL (ref 0.1–0.9)
Monocytes: 11 %
Neutrophils Absolute: 2.6 10*3/uL (ref 1.4–7.0)
Neutrophils: 44 %
Platelets: 203 10*3/uL (ref 150–450)
RBC: 4.25 x10E6/uL (ref 3.77–5.28)
RDW: 12.8 % (ref 11.7–15.4)
WBC: 5.7 10*3/uL (ref 3.4–10.8)

## 2021-11-24 LAB — TSH: TSH: 1.92 u[IU]/mL (ref 0.450–4.500)

## 2021-11-24 LAB — CK: Total CK: 77 U/L (ref 32–182)

## 2021-11-24 LAB — SEDIMENTATION RATE: Sed Rate: 14 mm/hr (ref 0–40)

## 2021-11-24 LAB — VITAMIN D 25 HYDROXY (VIT D DEFICIENCY, FRACTURES): Vit D, 25-Hydroxy: 47.6 ng/mL (ref 30.0–100.0)

## 2021-11-24 LAB — HEMOGLOBIN A1C
Est. average glucose Bld gHb Est-mCnc: 123 mg/dL
Hgb A1c MFr Bld: 5.9 % — ABNORMAL HIGH (ref 4.8–5.6)

## 2022-01-19 ENCOUNTER — Ambulatory Visit
Admission: RE | Admit: 2022-01-19 | Discharge: 2022-01-19 | Disposition: A | Discharge status: Home / Self Care | Payer: Medicare HMO | Source: Ambulatory Visit | Attending: Family Medicine | Admitting: Family Medicine

## 2022-01-19 DIAGNOSIS — E2839 Other primary ovarian failure: Secondary | ICD-10-CM | POA: Diagnosis not present

## 2022-01-19 DIAGNOSIS — M85852 Other specified disorders of bone density and structure, left thigh: Secondary | ICD-10-CM | POA: Diagnosis not present

## 2022-01-19 DIAGNOSIS — Z780 Asymptomatic menopausal state: Secondary | ICD-10-CM | POA: Diagnosis not present

## 2022-02-24 NOTE — Progress Notes (Signed)
I,Roshena L Chambers,acting as a scribe for Lelon Huh, MD.,have documented all relevant documentation on the behalf of Lelon Huh, MD,as directed by  Lelon Huh, MD while in the presence of Lelon Huh, MD.    Established patient visit   Patient: Beverly Bush   DOB: March 11, 1951   71 y.o. Female  MRN: [id number] Visit Date: 02/25/2022  Today's healthcare provider: Lelon Huh, MD   Chief Complaint  Patient presents with   Hypertension   Cough   Subjective    HPI  Hypertension, follow-up  BP Readings from Last 3 Encounters:  02/25/22 (!) 165/73  11/22/21 (!) 153/79  03/19/21 (!) 143/75   Wt Readings from Last 3 Encounters:  02/25/22 178 lb (80.7 kg)  11/22/21 177 lb (80.3 kg)  11/10/21 175 lb (79.4 kg)     She was last seen for hypertension 3 months ago.  BP at that visit was 153/79. Management since that visit includes increasing valsartan-hydrochlorothiazide (DIOVAN-HCT) to 160-25 MG tablet; Take 1 tablet by mouth daily.  She reports good compliance with treatment. She is not having side effects.  She is following a Regular diet. She is not exercising. She does not smoke.  Use of agents associated with hypertension: none.   Outside blood pressures are not checked. Symptoms: No chest pain No chest pressure  No palpitations No syncope  No dyspnea No orthopnea  No paroxysmal nocturnal dyspnea No lower extremity edema   Pertinent labs Lab Results  Component Value Date   CHOL 221 (H) 11/23/2021   HDL 88 11/23/2021   LDLCALC 122 (H) 11/23/2021   TRIG 66 11/23/2021   CHOLHDL 2.5 11/23/2021   Lab Results  Component Value Date   NA 145 (H) 11/23/2021   K 4.3 11/23/2021   CREATININE 0.81 11/23/2021   EGFR 79 11/23/2021   GLUCOSE 114 (H) 11/23/2021   TSH 1.920 11/23/2021     The 10-year ASCVD risk score (Arnett DK, et al., 2019) is:  19.1%  ---------------------------------------------------------------------------------------------------  Cough: Patient complains of productive cough and chest congestion for the past 2 days. Associated symptoms includes wheezing and sore throat. Patient has tried using OTC Cold and Flu medication with improvement of symptoms. She denies any fever. Patient states she just returned from a trip to [facility] with her sister, who tested positive for COVID this morning.   Medications: Outpatient Medications Prior to Visit  Medication Sig   celecoxib (CELEBREX) 100 MG capsule Take 1 capsule (100 mg total) by mouth 2 (two) times daily as needed.   Cholecalciferol 50 MCG (2000 UT) CAPS Take by mouth.   Ergocalciferol (VITAMIN D2) 2000 UNITS TABS Take 1 tablet by mouth daily.   fluticasone (FLONASE) 50 MCG/ACT nasal spray SPRAY 2 SPRAYS INTO EACH NOSTRIL EVERY DAY   Multiple Vitamin (MULTIVITAMIN ADULT PO) Take 1 tablet by mouth daily.   traMADol (ULTRAM) 50 MG tablet    triamcinolone (KENALOG) 0.025 % cream    valsartan-hydrochlorothiazide (DIOVAN-HCT) 160-25 MG tablet Take 1 tablet by mouth daily.   No facility-administered medications prior to visit.    Review of Systems  Constitutional:  Negative for appetite change, chills, fatigue and fever.  HENT:  Positive for congestion and sore throat.   Respiratory:  Positive for cough and wheezing. Negative for chest tightness and shortness of breath.   Cardiovascular:  Negative for chest pain and palpitations.  Gastrointestinal:  Negative for abdominal pain, nausea and vomiting.  Neurological:  Negative for dizziness and weakness.  Objective    BP (!) 165/73 (BP Location: Right Arm, Patient Position: Sitting, Cuff Size: Large)   Pulse 77   Temp 98.5 F (36.9 C) (Oral)   Resp 18   Wt 178 lb (80.7 kg)   SpO2 100% Comment: room air  BMI 32.56 kg/m    Physical Exam   General: Appearance:    Mildly obese female in no acute  distress  Eyes:    PERRL, conjunctiva/corneas clear, EOM's intact       Lungs:     Clear to auscultation bilaterally, respirations unlabored  Heart:    Normal heart rate. Normal rhythm. No murmurs, rubs, or gallops.    MS:   All extremities are intact.    Neurologic:   Awake, alert, oriented x 3. No apparent focal neurological defect.         Assessment & Plan     1. Primary hypertension Uncontrolled. Add amLODipine (NORVASC) 2.5 MG tablet; Take 1 tablet (2.5 mg total) by mouth daily.  Dispense: 90 tablet; Refill: 1  Follow up bp check 1-2 months.   2. Upper respiratory tract infection, unspecified type   3. Close exposure to COVID-19 virus Discussed empiric antiviral treatment. However she prefers to take only if she is known to have Covid.  She was given printed prescription for Paxlovid and will fill if home Covid test is positive. Advised it should be started within 1-2 days to be most effective.       The entirety of the information documented in the History of Present Illness, Review of Systems and Physical Exam were personally obtained by me. Portions of this information were initially documented by the CMA and reviewed by me for thoroughness and accuracy.     Lelon Huh, MD  [facility] [phone] (phone) [phone] (fax)  [place]

## 2022-02-25 ENCOUNTER — Encounter: Payer: Self-pay | Admitting: Family Medicine

## 2022-02-25 ENCOUNTER — Ambulatory Visit (INDEPENDENT_AMBULATORY_CARE_PROVIDER_SITE_OTHER): Payer: Medicare HMO | Admitting: Family Medicine

## 2022-02-25 VITALS — BP 165/73 | HR 77 | Temp 98.5°F | Resp 18 | Wt 178.0 lb

## 2022-02-25 DIAGNOSIS — I10 Essential (primary) hypertension: Secondary | ICD-10-CM | POA: Diagnosis not present

## 2022-02-25 DIAGNOSIS — J069 Acute upper respiratory infection, unspecified: Secondary | ICD-10-CM | POA: Diagnosis not present

## 2022-02-25 DIAGNOSIS — Z20822 Contact with and (suspected) exposure to covid-19: Secondary | ICD-10-CM

## 2022-02-25 MED ORDER — AMLODIPINE BESYLATE 2.5 MG PO TABS: 2.5000 mg | ORAL_TABLET | Freq: Every day | ORAL | 1 refills | Status: DC

## 2022-02-25 MED ORDER — NIRMATRELVIR/RITONAVIR (PAXLOVID)TABLET: 3.0000 | ORAL_TABLET | Freq: Two times a day (BID) | ORAL | 0 refills | Status: AC

## 2022-02-25 NOTE — Patient Instructions (Signed)
.   Please review the attached list of medications and notify my office if there are any errors.   . Please bring all of your medications to every appointment so we can make sure that our medication list is the same as yours.   

## 2022-03-28 DIAGNOSIS — H5203 Hypermetropia, bilateral: Secondary | ICD-10-CM | POA: Diagnosis not present

## 2022-04-26 ENCOUNTER — Other Ambulatory Visit: Payer: Self-pay | Admitting: Family Medicine

## 2022-04-26 DIAGNOSIS — I10 Essential (primary) hypertension: Secondary | ICD-10-CM

## 2022-04-26 MED ORDER — VALSARTAN-HYDROCHLOROTHIAZIDE 160-25 MG PO TABS
1.0000 | ORAL_TABLET | Freq: Every day | ORAL | 4 refills | Status: DC
Start: 2022-04-26 — End: 2023-05-01

## 2022-05-02 ENCOUNTER — Telehealth: Payer: Self-pay | Admitting: Family Medicine

## 2022-05-02 NOTE — Telephone Encounter (Signed)
[facility] faxed refill request for the following medications:  amLODipine (NORVASC) 2.5 MG tablet   Please advise.

## 2022-05-03 ENCOUNTER — Other Ambulatory Visit: Payer: Self-pay

## 2022-05-03 DIAGNOSIS — I10 Essential (primary) hypertension: Secondary | ICD-10-CM

## 2022-05-03 MED ORDER — AMLODIPINE BESYLATE 2.5 MG PO TABS: 2.5000 mg | ORAL_TABLET | Freq: Every day | ORAL | 1 refills | Status: DC

## 2022-05-09 DIAGNOSIS — H524 Presbyopia: Secondary | ICD-10-CM | POA: Diagnosis not present

## 2022-05-24 NOTE — Progress Notes (Unsigned)
I,Sulibeya S Dimas,acting as a scribe for Lelon Huh, MD.,have documented all relevant documentation on the behalf of Lelon Huh, MD,as directed by  Lelon Huh, MD while in the presence of Lelon Huh, MD.     Established patient visit   Patient: Beverly Bush   DOB: 15-Jun-1951   71 y.o. Female  MRN: [id number] Visit Date: 05/25/2022  Today's healthcare provider: Lelon Huh, MD   No chief complaint on file.  Subjective    HPI  Hypertension, follow-up  BP Readings from Last 3 Encounters:  02/25/22 (!) 165/73  11/22/21 (!) 153/79  03/19/21 (!) 143/75   Wt Readings from Last 3 Encounters:  02/25/22 178 lb (80.7 kg)  11/22/21 177 lb (80.3 kg)  11/10/21 175 lb (79.4 kg)     She was last seen for hypertension 3 months ago.  BP at that visit was 165/73. Management since that visit includes add Amlopdipine 2.34m.  She reports {excellent/good/fair/poor:19665} compliance with treatment. She {is/is not:9024} having side effects. {document side effects if present:1}  Use of agents associated with hypertension: none.   Outside blood pressures are {***enter patient reported home BP readings, or 'not being checked':1}. Symptoms: {Yes/No:20286} chest pain {Yes/No:20286} chest pressure  {Yes/No:20286} palpitations {Yes/No:20286} syncope  {Yes/No:20286} dyspnea {Yes/No:20286} orthopnea  {Yes/No:20286} paroxysmal nocturnal dyspnea {Yes/No:20286} lower extremity edema   Pertinent labs Lab Results  Component Value Date   CHOL 221 (H) 11/23/2021   HDL 88 11/23/2021   LDLCALC 122 (H) 11/23/2021   TRIG 66 11/23/2021   CHOLHDL 2.5 11/23/2021   Lab Results  Component Value Date   NA 145 (H) 11/23/2021   K 4.3 11/23/2021   CREATININE 0.81 11/23/2021   EGFR 79 11/23/2021   GLUCOSE 114 (H) 11/23/2021   TSH 1.920 11/23/2021     The 10-year ASCVD risk score (Arnett DK, et al., 2019) is:  19.1%  ---------------------------------------------------------------------------------------------------  Medications: Outpatient Medications Prior to Visit  Medication Sig   amLODipine (NORVASC) 2.5 MG tablet Take 1 tablet (2.5 mg total) by mouth daily.   celecoxib (CELEBREX) 100 MG capsule Take 1 capsule (100 mg total) by mouth 2 (two) times daily as needed.   Cholecalciferol 50 MCG (2000 UT) CAPS Take by mouth.   Ergocalciferol (VITAMIN D2) 2000 UNITS TABS Take 1 tablet by mouth daily.   fluticasone (FLONASE) 50 MCG/ACT nasal spray SPRAY 2 SPRAYS INTO EACH NOSTRIL EVERY DAY   Multiple Vitamin (MULTIVITAMIN ADULT PO) Take 1 tablet by mouth daily.   traMADol (ULTRAM) 50 MG tablet    triamcinolone (KENALOG) 0.025 % cream    valsartan-hydrochlorothiazide (DIOVAN-HCT) 160-25 MG tablet Take 1 tablet by mouth daily.   No facility-administered medications prior to visit.    Review of Systems  Constitutional:  Negative for appetite change, chills, fatigue and fever.  HENT:  Negative for congestion and sore throat.   Respiratory:  Negative for cough, chest tightness, shortness of breath and wheezing.   Cardiovascular:  Negative for chest pain and palpitations.  Gastrointestinal:  Negative for abdominal pain, nausea and vomiting.  Neurological:  Negative for dizziness and weakness.        Objective    There were no vitals taken for this visit. BP Readings from Last 3 Encounters:  02/25/22 (!) 165/73  11/22/21 (!) 153/79  03/19/21 (!) 143/75   Wt Readings from Last 3 Encounters:  02/25/22 178 lb (80.7 kg)  11/22/21 177 lb (80.3 kg)  11/10/21 175 lb (79.4 kg)      Physical Exam  ***  No results found for any visits on 05/25/22.  Assessment & Plan     ***  No follow-ups on file.      {provider attestation***:1}   Lelon Huh, MD  [facility] [phone] (phone) [phone] (fax)  [place]

## 2022-05-25 ENCOUNTER — Encounter: Payer: Self-pay | Admitting: Family Medicine

## 2022-05-25 ENCOUNTER — Ambulatory Visit (INDEPENDENT_AMBULATORY_CARE_PROVIDER_SITE_OTHER): Payer: Medicare HMO | Admitting: Family Medicine

## 2022-05-25 VITALS — BP 127/65 | HR 70 | Temp 98.0°F | Resp 16 | Wt 180.8 lb

## 2022-05-25 DIAGNOSIS — I10 Essential (primary) hypertension: Secondary | ICD-10-CM | POA: Diagnosis not present

## 2022-05-25 DIAGNOSIS — R002 Palpitations: Secondary | ICD-10-CM | POA: Diagnosis not present

## 2022-05-25 DIAGNOSIS — Z23 Encounter for immunization: Secondary | ICD-10-CM | POA: Diagnosis not present

## 2022-05-25 NOTE — Patient Instructions (Addendum)
Please review the attached list of medications and notify my office if there are any errors.   I recommend you try taking over the counter magnesium oxide 400mg  once a day to prevent palpations.

## 2022-06-27 DIAGNOSIS — J40 Bronchitis, not specified as acute or chronic: Secondary | ICD-10-CM | POA: Diagnosis not present

## 2022-07-12 ENCOUNTER — Other Ambulatory Visit: Payer: Self-pay | Admitting: Family Medicine

## 2022-07-12 DIAGNOSIS — Z1231 Encounter for screening mammogram for malignant neoplasm of breast: Secondary | ICD-10-CM

## 2022-08-12 ENCOUNTER — Ambulatory Visit
Admission: RE | Admit: 2022-08-12 | Discharge: 2022-08-12 | Disposition: A | Discharge status: Home / Self Care | Payer: Medicare HMO | Source: Ambulatory Visit | Attending: Family Medicine | Admitting: Family Medicine

## 2022-08-12 DIAGNOSIS — Z1231 Encounter for screening mammogram for malignant neoplasm of breast: Secondary | ICD-10-CM | POA: Insufficient documentation

## 2022-09-28 ENCOUNTER — Other Ambulatory Visit: Payer: Self-pay | Admitting: Family Medicine

## 2022-09-28 DIAGNOSIS — I10 Essential (primary) hypertension: Secondary | ICD-10-CM

## 2022-11-15 ENCOUNTER — Ambulatory Visit (INDEPENDENT_AMBULATORY_CARE_PROVIDER_SITE_OTHER): Payer: Medicare PPO

## 2022-11-15 VITALS — Ht 62.0 in | Wt 180.0 lb

## 2022-11-15 DIAGNOSIS — Z0000 Encounter for general adult medical examination without abnormal findings: Secondary | ICD-10-CM | POA: Diagnosis not present

## 2022-11-15 NOTE — Progress Notes (Signed)
I connected with  Beverly Bush on 11/15/22 by a audio enabled telemedicine application and verified that I am speaking with the correct person using two identifiers.  Patient Location: Home  Provider Location: Office/Clinic  I discussed the limitations of evaluation and management by telemedicine. The patient expressed understanding and agreed to proceed.  Subjective:   Beverly Bush is a 72 y.o. female who presents for Medicare Annual (Subsequent) preventive examination.  Review of Systems    Cardiac Risk Factors include: advanced age (>56men, >86 women);dyslipidemia;hypertension;obesity (BMI >30kg/m2)    Objective:    Today's Vitals   11/15/22 0921  Weight: 180 lb (81.6 kg)  Height: 5\' 2"  (1.575 m)   Body mass index is 32.92 kg/m.     11/15/2022    9:39 AM 11/10/2021   11:09 AM 05/07/2018    8:11 AM  Advanced Directives  Does Patient Have a Medical Advance Directive? No No No  Would patient like information on creating a medical advance directive?  No - Patient declined     Current Medications (verified) Outpatient Encounter Medications as of 11/15/2022  Medication Sig   amLODipine (NORVASC) 2.5 MG tablet TAKE 1 TABLET EVERY DAY   Ergocalciferol (VITAMIN D2) 2000 UNITS TABS Take 1 tablet by mouth daily.   Multiple Vitamin (MULTIVITAMIN ADULT PO) Take 1 tablet by mouth daily.   valsartan-hydrochlorothiazide (DIOVAN-HCT) 160-25 MG tablet Take 1 tablet by mouth daily.   celecoxib (CELEBREX) 100 MG capsule Take 1 capsule (100 mg total) by mouth 2 (two) times daily as needed. (Patient not taking: Reported on 11/15/2022)   fluticasone (FLONASE) 50 MCG/ACT nasal spray SPRAY 2 SPRAYS INTO EACH NOSTRIL EVERY DAY (Patient not taking: Reported on 05/25/2022)   No facility-administered encounter medications on file as of 11/15/2022.    Allergies (verified) Patient has no known allergies.   History: Past Medical History:  Diagnosis Date   Sleep apnea    Past Surgical  History:  Procedure Laterality Date   ABDOMINAL HYSTERECTOMY  2008   Due to heavy bleeding. Complete; does not have ovaries per patient   COLONOSCOPY WITH PROPOFOL N/A 05/07/2018   Procedure: COLONOSCOPY WITH PROPOFOL;  Surgeon: Manya Silvas, MD;  Location: [facility] ENDOSCOPY;  Service: Endoscopy;  Laterality: N/A;   Family History  Problem Relation Age of Onset   Diabetes Mother        non insulin dependant diabetes mellitus   Hypertension Father    Cancer Sister        breast   Breast cancer Sister    Cancer Sister        colon cancer   Breast cancer Sister    Breast cancer Paternal Aunt    Social History   Socioeconomic History   Marital status: Married    Spouse name: Not on file   Number of children: 2   Years of education: College Gr   Highest education level: Not on file  Occupational History   Not on file  Tobacco Use   Smoking status: Never   Smokeless tobacco: Never  Vaping Use   Vaping Use: Not on file  Substance and Sexual Activity   Alcohol use: No   Drug use: No   Sexual activity: Not Currently  Other Topics Concern   Not on file  Social History Narrative   Not on file   Social Determinants of Health   Financial Resource Strain: Low Risk  (11/14/2022)   Overall Financial Resource Strain (CARDIA)    Difficulty  of Paying Living Expenses: Not hard at all  Food Insecurity: No Food Insecurity (11/14/2022)   Hunger Vital Sign    Worried About Running Out of Food in the Last Year: Never true    Ran Out of Food in the Last Year: Never true  Transportation Needs: No Transportation Needs (11/14/2022)   PRAPARE - Hydrologist (Medical): No    Lack of Transportation (Non-Medical): No  Physical Activity: Sufficiently Active (11/14/2022)   Exercise Vital Sign    Days of Exercise per Week: 3 days    Minutes of Exercise per Session: 50 min  Stress: No Stress Concern Present (11/14/2022)   [place]    Feeling of Stress : Not at all  Social Connections: Unknown (11/14/2022)   Social Connection and Isolation Panel [NHANES]    Frequency of Communication with Friends and Family: More than three times a week    Frequency of Social Gatherings with Friends and Family: More than three times a week    Attends Religious Services: Not on Advertising copywriter or Organizations: Yes    Attends Music therapist: More than 4 times per year    Marital Status: Married    Tobacco Counseling Counseling given: Not Answered   Clinical Intake:  Pre-visit preparation completed: Yes  Pain : No/denies pain     BMI - recorded: 32.92 Nutritional Status: BMI > 30  Obese Nutritional Risks: None Diabetes: No  How often do you need to have someone help you when you read instructions, pamphlets, or other written materials from your doctor or pharmacy?: 1 - Never  Diabetic?no  Interpreter Needed?: No  Information entered by :: [name],LPN   Activities of Daily Living    11/14/2022   10:54 PM 05/25/2022    9:58 AM  In your present state of health, do you have any difficulty performing the following activities:  Hearing? 0 0  Vision? 0 0  Difficulty concentrating or making decisions? 0 0  Walking or climbing stairs? 0 0  Dressing or bathing? 0 0  Doing errands, shopping? 0 0  Preparing Food and eating ? N   Using the Toilet? N   In the past six months, have you accidently leaked urine? N   Do you have problems with loss of bowel control? N   Managing your Medications? N   Managing your Finances? N   Housekeeping or managing your Housekeeping? N     Patient Care Team: Birdie Sons, MD as PCP - General (Family Medicine) [place] any recent Medical Services you may have received from other than [facility] providers in the past year (date may be approximate).     Assessment:   This is a routine  wellness examination for Beverly Bush.  Hearing/Vision screen Hearing Screening - Comments:: Adequate hearing Vision Screening - Comments:: Adequate vision with glasses [facility]  Dietary issues and exercise activities discussed: Current Exercise Habits: Structured exercise class, Type of exercise: walking, Time (Minutes): 60, Frequency (Times/Week): 3, Weekly Exercise (Minutes/Week): 180, Intensity: Mild, Exercise limited by: None identified   Goals Addressed             This Visit's Progress    DIET - EAT MORE FRUITS AND VEGETABLES   On track      Depression Screen    11/15/2022    9:38 AM 05/25/2022    9:58  AM 11/10/2021   11:07 AM 03/19/2021    9:36 AM 11/17/2020    3:04 PM 08/28/2019    9:20 AM 04/09/2018    2:13 PM  PHQ 2/9 Scores  PHQ - 2 Score 0 0 0 1 0 0 0  PHQ- 9 Score  0  2 4 0 2    Fall Risk    11/14/2022   10:54 PM 05/25/2022    9:58 AM 11/10/2021   11:10 AM 03/19/2021    9:35 AM 11/17/2020    3:03 PM  Fall Risk   Falls in the past year?  0 0 0 0  Number falls in past yr: 0 0 0 0 0  Injury with Fall? 0 0 0 0 0  Risk for fall due to :  No Fall Risks No Fall Risks No Fall Risks   Follow up  Falls evaluation completed Falls evaluation completed      FALL RISK PREVENTION PERTAINING TO THE HOME:  Any stairs in or around the home? no If so, are there any without handrails? No  Home free of loose throw rugs in walkways, pet beds, electrical cords, etc? Yes  Adequate lighting in your home to reduce risk of falls? Yes   ASSISTIVE DEVICES UTILIZED TO PREVENT FALLS:  Life alert? No  Use of a cane, walker or w/c? No  Grab bars in the bathroom? No  Shower chair or bench in shower? No  Elevated toilet seat or a handicapped toilet? No   Cognitive Function:        11/15/2022    9:25 AM  6CIT Screen  What Year? 0 points  What month? 0 points  What time? 0 points  Count back from 20 0 points  Months in reverse 0 points  Repeat phrase 2 points  Total Score 2  points    Immunizations Immunization History  Administered Date(s) Administered   Fluad Quad(high Dose 65+) 07/21/2021, 05/25/2022   Influenza, High Dose Seasonal PF 06/13/2018, 08/25/2020   Influenza,inj,Quad PF,6+ Mos 05/27/2015, 06/28/2016, 07/23/2019   PFIZER Comirnaty(Gray Top)Covid-19 Tri-Sucrose Vaccine 04/21/2021   PFIZER(Purple Top)SARS-COV-2 Vaccination 09/02/2019, 09/23/2019, 08/10/2020   PNEUMOCOCCAL CONJUGATE-20 05/25/2022   Pneumococcal Polysaccharide-23 12/17/2019   Td 04/09/2018   Tdap 04/22/2008   Zoster Recombinat (Shingrix) 04/09/2018, 06/13/2018   Zoster, Live 09/23/2014    TDAP status: Up to date  Flu Vaccine status: Up to date  Pneumococcal vaccine status: Up to date  Covid-19 vaccine status: Completed vaccines  Qualifies for Shingles Vaccine? Yes   Zostavax completed Yes     Screening Tests Health Maintenance  Topic Date Due   COVID-19 Vaccine (5 - 2023-24 season) 04/22/2022   COLONOSCOPY (Pts 45-105yrs Insurance coverage will need to be confirmed)  05/08/2023   Medicare Annual Wellness (AWV)  11/15/2023   MAMMOGRAM  08/12/2024   DEXA SCAN  01/19/2025   DTaP/Tdap/Td (3 - Td or Tdap) 04/09/2028   Pneumonia Vaccine 39+ Years old  Completed   INFLUENZA VACCINE  Completed   Hepatitis C Screening  Completed   Zoster Vaccines- Shingrix  Completed   HPV VACCINES  Aged Out    Health Maintenance  Health Maintenance Due  Topic Date Due   COVID-19 Vaccine (5 - 2023-24 season) 04/22/2022    Colorectal cancer screening: Type of screening: Colonoscopy. Completed yes. Repeat every 5 years  Mammogram status: Completed yes. Repeat every year  Bone Density status: Completed yes. Results reflect: Bone density results: OSTEOPENIA. Repeat every 5 years.  Lung Cancer  Screening: (Low Dose CT Chest recommended if Age 78-80 years, 30 pack-year currently smoking OR have quit w/in 15years.) does not qualify.   Lung Cancer Screening Referral: no  Additional  Screening:  Hepatitis C Screening: does not qualify; Completed yes  Vision Screening: Recommended annual ophthalmology exams for early detection of glaucoma and other disorders of the eye. Is the patient up to date with their annual eye exam?  No  Who is the provider or what is the name of the office in which the patient attends annual eye exams? [facility] If pt is not established with a provider, would they like to be referred to a provider to establish care? No .   Dental Screening: Recommended annual dental exams for proper oral hygiene  Community Resource Referral / Chronic Care Management: CRR required this visit?  No   CCM required this visit?  No      Plan:     I have personally reviewed and noted the following in the patient's chart:   Medical and social history Use of alcohol, tobacco or illicit drugs  Current medications and supplements including opioid prescriptions. Patient is not currently taking opioid prescriptions. Functional ability and status Nutritional status Physical activity Advanced directives List of other physicians Hospitalizations, surgeries, and ER visits in previous 12 months Vitals Screenings to include cognitive, depression, and falls Referrals and appointments  In addition, I have reviewed and discussed with patient certain preventive protocols, quality metrics, and best practice recommendations. A written personalized care plan for preventive services as well as general preventive health recommendations were provided to patient.     Roger Shelter, LPN   [id number]   Nurse Notes: pt  says she is doing well and has no concerns or questions at this time.

## 2022-11-15 NOTE — Patient Instructions (Signed)
Beverly Bush , Thank you for taking time to come for your Medicare Wellness Visit. I appreciate your ongoing commitment to your health goals. Please review the following plan we discussed and let me know if I can assist you in the future.   These are the goals we discussed:  Goals      DIET - EAT MORE FRUITS AND VEGETABLES        This is a list of the screening recommended for you and due dates:  Health Maintenance  Topic Date Due   COVID-19 Vaccine (5 - 2023-24 season) 04/22/2022   Colon Cancer Screening  05/08/2023   Medicare Annual Wellness Visit  11/15/2023   Mammogram  08/12/2024   DEXA scan (bone density measurement)  01/19/2025   DTaP/Tdap/Td vaccine (3 - Td or Tdap) 04/09/2028   Pneumonia Vaccine  Completed   Flu Shot  Completed   Hepatitis C Screening: USPSTF Recommendation to screen - Ages 18-79 yo.  Completed   Zoster (Shingles) Vaccine  Completed   HPV Vaccine  Aged Out    Advanced directives: no  Conditions/risks identified: none  Next appointment: Follow up in one year for your annual wellness visit 11/20/2023 @9 :15am telephone   Preventive Care 65 Years and Older, Female Preventive care refers to lifestyle choices and visits with your health care provider that can promote health and wellness. What does preventive care include? A yearly physical exam. This is also called an annual well check. Dental exams once or twice a year. Routine eye exams. Ask your health care provider how often you should have your eyes checked. Personal lifestyle choices, including: Daily care of your teeth and gums. Regular physical activity. Eating a healthy diet. Avoiding tobacco and drug use. Limiting alcohol use. Practicing safe sex. Taking low-dose aspirin every day. Taking vitamin and mineral supplements as recommended by your health care provider. What happens during an annual well check? The services and screenings done by your health care provider during your annual well  check will depend on your age, overall health, lifestyle risk factors, and family history of disease. Counseling  Your health care provider may ask you questions about your: Alcohol use. Tobacco use. Drug use. Emotional well-being. Home and relationship well-being. Sexual activity. Eating habits. History of falls. Memory and ability to understand (cognition). Work and work Statistician. Reproductive health. Screening  You may have the following tests or measurements: Height, weight, and BMI. Blood pressure. Lipid and cholesterol levels. These may be checked every 5 years, or more frequently if you are over 45 years old. Skin check. Lung cancer screening. You may have this screening every year starting at age 94 if you have a 30-pack-year history of smoking and currently smoke or have quit within the past 15 years. Fecal occult blood test (FOBT) of the stool. You may have this test every year starting at age 48. Flexible sigmoidoscopy or colonoscopy. You may have a sigmoidoscopy every 5 years or a colonoscopy every 10 years starting at age 56. Hepatitis C blood test. Hepatitis B blood test. Sexually transmitted disease (STD) testing. Diabetes screening. This is done by checking your blood sugar (glucose) after you have not eaten for a while (fasting). You may have this done every 1-3 years. Bone density scan. This is done to screen for osteoporosis. You may have this done starting at age 40. Mammogram. This may be done every 1-2 years. Talk to your health care provider about how often you should have regular mammograms. Talk with your  health care provider about your test results, treatment options, and if necessary, the need for more tests. Vaccines  Your health care provider may recommend certain vaccines, such as: Influenza vaccine. This is recommended every year. Tetanus, diphtheria, and acellular pertussis (Tdap, Td) vaccine. You may need a Td booster every 10 years. Zoster  vaccine. You may need this after age 48. Pneumococcal 13-valent conjugate (PCV13) vaccine. One dose is recommended after age 62. Pneumococcal polysaccharide (PPSV23) vaccine. One dose is recommended after age 65. Talk to your health care provider about which screenings and vaccines you need and how often you need them. This information is not intended to replace advice given to you by your health care provider. Make sure you discuss any questions you have with your health care provider. Document Released: 09/04/2015 Document Revised: 04/27/2016 Document Reviewed: 06/09/2015 Elsevier Interactive Patient Education  2017 [place] Prevention in the Home Falls can cause injuries. They can happen to people of all ages. There are many things you can do to make your home safe and to help prevent falls. What can I do on the outside of my home? Regularly fix the edges of walkways and driveways and fix any cracks. Remove anything that might make you trip as you walk through a door, such as a raised step or threshold. Trim any bushes or trees on the path to your home. Use bright outdoor lighting. Clear any walking paths of anything that might make someone trip, such as rocks or tools. Regularly check to see if handrails are loose or broken. Make sure that both sides of any steps have handrails. Any raised decks and porches should have guardrails on the edges. Have any leaves, snow, or ice cleared regularly. Use sand or salt on walking paths during winter. Clean up any spills in your garage right away. This includes oil or grease spills. What can I do in the bathroom? Use night lights. Install grab bars by the toilet and in the tub and shower. Do not use towel bars as grab bars. Use non-skid mats or decals in the tub or shower. If you need to sit down in the shower, use a plastic, non-slip stool. Keep the floor dry. Clean up any water that spills on the floor as soon as it happens. Remove  soap buildup in the tub or shower regularly. Attach bath mats securely with double-sided non-slip rug tape. Do not have throw rugs and other things on the floor that can make you trip. What can I do in the bedroom? Use night lights. Make sure that you have a light by your bed that is easy to reach. Do not use any sheets or blankets that are too big for your bed. They should not hang down onto the floor. Have a firm chair that has side arms. You can use this for support while you get dressed. Do not have throw rugs and other things on the floor that can make you trip. What can I do in the kitchen? Clean up any spills right away. Avoid walking on wet floors. Keep items that you use a lot in easy-to-reach places. If you need to reach something above you, use a strong step stool that has a grab bar. Keep electrical cords out of the way. Do not use floor polish or wax that makes floors slippery. If you must use wax, use non-skid floor wax. Do not have throw rugs and other things on the floor that can make you trip. What  can I do with my stairs? Do not leave any items on the stairs. Make sure that there are handrails on both sides of the stairs and use them. Fix handrails that are broken or loose. Make sure that handrails are as long as the stairways. Check any carpeting to make sure that it is firmly attached to the stairs. Fix any carpet that is loose or worn. Avoid having throw rugs at the top or bottom of the stairs. If you do have throw rugs, attach them to the floor with carpet tape. Make sure that you have a light switch at the top of the stairs and the bottom of the stairs. If you do not have them, ask someone to add them for you. What else can I do to help prevent falls? Wear shoes that: Do not have high heels. Have rubber bottoms. Are comfortable and fit you well. Are closed at the toe. Do not wear sandals. If you use a stepladder: Make sure that it is fully opened. Do not climb a  closed stepladder. Make sure that both sides of the stepladder are locked into place. Ask someone to hold it for you, if possible. Clearly mark and make sure that you can see: Any grab bars or handrails. First and last steps. Where the edge of each step is. Use tools that help you move around (mobility aids) if they are needed. These include: Canes. Walkers. Scooters. Crutches. Turn on the lights when you go into a dark area. Replace any light bulbs as soon as they burn out. Set up your furniture so you have a clear path. Avoid moving your furniture around. If any of your floors are uneven, fix them. If there are any pets around you, be aware of where they are. Review your medicines with your doctor. Some medicines can make you feel dizzy. This can increase your chance of falling. Ask your doctor what other things that you can do to help prevent falls. This information is not intended to replace advice given to you by your health care provider. Make sure you discuss any questions you have with your health care provider. Document Released: 06/04/2009 Document Revised: 01/14/2016 Document Reviewed: 09/12/2014 Elsevier Interactive Patient Education  2017 [employer].

## 2022-11-22 NOTE — Progress Notes (Signed)
Established patient visit   Patient: Beverly Bush   DOB: 22-Jan-1951   72 y.o. Female  MRN: [id number] Visit Date: 11/23/2022  Today's healthcare provider: Mila Merry, MD     Subjective    HPI  Hypertension, follow-up  BP Readings from Last 3 Encounters:  11/23/22 131/69  05/25/22 127/65  02/25/22 (!) 165/73   Wt Readings from Last 3 Encounters:  11/23/22 182 lb (82.6 kg)  11/15/22 180 lb (81.6 kg)  05/25/22 180 lb 12.8 oz (82 kg)     She was last seen for hypertension 6 months ago.  BP at that visit was as above. Management since that visit includes continuing the same medications.   She reports good compliance with treatment. She is not having side effects.  She is following a Regular diet. She is exercising. She does not smoke.  Use of agents associated with hypertension: none.   Outside blood pressures are not being checked. Symptoms: No chest pain No chest pressure  No palpitations No syncope  No dyspnea No orthopnea  No paroxysmal nocturnal dyspnea No lower extremity edema   Pertinent labs Lab Results  Component Value Date   CHOL 221 (H) 11/23/2021   HDL 88 11/23/2021   LDLCALC 122 (H) 11/23/2021   TRIG 66 11/23/2021   CHOLHDL 2.5 11/23/2021   Lab Results  Component Value Date   NA 145 (H) 11/23/2021   K 4.3 11/23/2021   CREATININE 0.81 11/23/2021   EGFR 79 11/23/2021   GLUCOSE 114 (H) 11/23/2021   TSH 1.920 11/23/2021     The 10-year ASCVD risk score (Arnett DK, et al., 2019) is: 13.1%  ---------------------------------------------------------------------------------------------------  She also requests trial of weight loss medications.   Medications: Outpatient Medications Prior to Visit  Medication Sig   amLODipine (NORVASC) 2.5 MG tablet TAKE 1 TABLET EVERY DAY   celecoxib (CELEBREX) 100 MG capsule Take 1 capsule (100 mg total) by mouth 2 (two) times daily as needed. (Patient not taking: Reported on 11/15/2022)    Ergocalciferol (VITAMIN D2) 2000 UNITS TABS Take 1 tablet by mouth daily.   fluticasone (FLONASE) 50 MCG/ACT nasal spray SPRAY 2 SPRAYS INTO EACH NOSTRIL EVERY DAY (Patient not taking: Reported on 05/25/2022)   Multiple Vitamin (MULTIVITAMIN ADULT PO) Take 1 tablet by mouth daily.   valsartan-hydrochlorothiazide (DIOVAN-HCT) 160-25 MG tablet Take 1 tablet by mouth daily.   No facility-administered medications prior to visit.    Review of Systems  Constitutional:  Negative for appetite change, chills, fatigue and fever.  Respiratory:  Negative for chest tightness and shortness of breath.   Cardiovascular:  Negative for chest pain and palpitations.  Gastrointestinal:  Negative for abdominal pain, nausea and vomiting.  Neurological:  Negative for dizziness and weakness.      Objective    BP 131/69 (BP Location: Left Arm, Patient Position: Sitting, Cuff Size: Normal)   Pulse 70   Temp 98.6 F (37 C) (Oral)   Wt 182 lb (82.6 kg)   SpO2 100%   BMI 33.29 kg/m   Physical Exam   General: Appearance:    Obese female in no acute distress  Eyes:    PERRL, conjunctiva/corneas clear, EOM's intact       Lungs:     Clear to auscultation bilaterally, respirations unlabored  Heart:    Normal heart rate. Normal rhythm. No murmurs, rubs, or gallops.    MS:   All extremities are intact.    Neurologic:   Awake, alert,  oriented x 3. No apparent focal neurological defect.         Assessment & Plan     1. Primary hypertension Well controlled.  Continue current medications.   - CBC - Comprehensive metabolic panel  2. Vitamin D deficiency  - VITAMIN D 25 Hydroxy (Vit-D Deficiency, Fractures)  3. Pre-diabetes  - Hemoglobin A1c  4. Pure hypercholesterolemia Diet controlled.  - Lipid panel  5. Obesity (BMI 30-39.9) She would like to try weight loss medication. Counseled on potential adverse effects and that may aggravate palpations related to ectopic atrial beats.   - phentermine 15 MG  capsule; Take 1 capsule (15 mg total) by mouth every morning.  Dispense: 30 capsule; Refill: 0 - topiramate (TOPAMAX) 50 MG tablet; 1/2 tablet every morning for 14 days, then increase to 1 tablet every morning  Dispense: 30 tablet; Refill: 1  Follow up in 4 weeks.   6. Ectopic atrial beats Currently not significantly symptomatic.   7. OSA on CPAP Using CPAP every night. Previously seemed to help energy level but not as helpful any more.       The entirety of the information documented in the History of Present Illness, Review of Systems and Physical Exam were personally obtained by me. Portions of this information were initially documented by the CMA and reviewed by me for thoroughness and accuracy.     Mila Merry, MD  [facility] [phone] (phone) [phone] (fax)  [facility]

## 2022-11-23 ENCOUNTER — Ambulatory Visit: Payer: Self-pay

## 2022-11-23 ENCOUNTER — Ambulatory Visit (INDEPENDENT_AMBULATORY_CARE_PROVIDER_SITE_OTHER): Payer: Medicare PPO | Admitting: Family Medicine

## 2022-11-23 VITALS — BP 131/69 | HR 70 | Temp 98.6°F | Wt 182.0 lb

## 2022-11-23 DIAGNOSIS — I491 Atrial premature depolarization: Secondary | ICD-10-CM | POA: Diagnosis not present

## 2022-11-23 DIAGNOSIS — R7303 Prediabetes: Secondary | ICD-10-CM | POA: Diagnosis not present

## 2022-11-23 DIAGNOSIS — I10 Essential (primary) hypertension: Secondary | ICD-10-CM

## 2022-11-23 DIAGNOSIS — E559 Vitamin D deficiency, unspecified: Secondary | ICD-10-CM

## 2022-11-23 DIAGNOSIS — E7800 Pure hypercholesterolemia, unspecified: Secondary | ICD-10-CM

## 2022-11-23 DIAGNOSIS — G4733 Obstructive sleep apnea (adult) (pediatric): Secondary | ICD-10-CM | POA: Diagnosis not present

## 2022-11-23 DIAGNOSIS — E669 Obesity, unspecified: Secondary | ICD-10-CM | POA: Diagnosis not present

## 2022-11-23 MED ORDER — TOPIRAMATE 50 MG PO TABS
ORAL_TABLET | ORAL | 1 refills | Status: DC
Start: 2022-11-23 — End: 2022-12-15

## 2022-11-23 MED ORDER — PHENTERMINE HCL 15 MG PO CAPS
15.0000 mg | ORAL_CAPSULE | ORAL | 0 refills | Status: DC
Start: 2022-11-23 — End: 2022-12-23

## 2022-11-23 NOTE — Patient Instructions (Signed)
.   Please review the attached list of medications and notify my office if there are any errors.   . Please bring all of your medications to every appointment so we can make sure that our medication list is the same as yours.   

## 2022-11-23 NOTE — Telephone Encounter (Signed)
  Chief Complaint: medication assistance Symptoms: NA Frequency: today Pertinent Negatives: NA Disposition: [] ED /[] Urgent Care (no appt availability in office) / [] Appointment(In office/virtual)/ []  [place] [facility]/ [x] Home Care/ [] Refused Recommended Disposition /[] [place] [facility]/ []  Follow-up with PCP Additional Notes: pt was prescribed phentermine and Topamax and wanting to know which medication Dr. Caryn Section recommended her taking in the evenings. I advised her to take the Phentermine in the AM and Topamax in the PM in case causing drowsiness. Pt verbalized understanding and had no further questions.  Summary: How to take her medication   Patient called in as she was prescribed medication at her appt today but they both say take in the morning but she says Dr Caryn Section said she could take one at night. Please advise patient of which medication would be best taken at night     Reason for Disposition  Caller has medicine question only, adult not sick, AND triager answers question  Answer Assessment - Initial Assessment Questions 1. NAME of MEDICINE: "What medicine(s) are you calling about?"     Phentermine and Topamax 2. QUESTION: "What is your question?" (e.g., double dose of medicine, side effect)     Wanting to know which one Dr. Caryn Section said to take in evening 3. PRESCRIBER: "Who prescribed the medicine?" Reason: if prescribed by specialist, call should be referred to that group.     Dr. Caryn Section  Protocols used: Medication Question Call-A-AH

## 2022-11-24 LAB — VITAMIN D 25 HYDROXY (VIT D DEFICIENCY, FRACTURES): Vit D, 25-Hydroxy: 52.5 ng/mL (ref 30.0–100.0)

## 2022-11-24 LAB — HEMOGLOBIN A1C
Est. average glucose Bld gHb Est-mCnc: 128 mg/dL
Hgb A1c MFr Bld: 6.1 % — ABNORMAL HIGH (ref 4.8–5.6)

## 2022-11-24 LAB — CBC
Hematocrit: 36.4 % (ref 34.0–46.6)
Hemoglobin: 12.3 g/dL (ref 11.1–15.9)
MCH: 30.3 pg (ref 26.6–33.0)
MCHC: 33.8 g/dL (ref 31.5–35.7)
MCV: 90 fL (ref 79–97)
Platelets: 218 10*3/uL (ref 150–450)
RBC: 4.06 x10E6/uL (ref 3.77–5.28)
RDW: 12.7 % (ref 11.7–15.4)
WBC: 5.9 10*3/uL (ref 3.4–10.8)

## 2022-11-24 LAB — LIPID PANEL
Chol/HDL Ratio: 2.6 ratio (ref 0.0–4.4)
Cholesterol, Total: 217 mg/dL — ABNORMAL HIGH (ref 100–199)
HDL: 85 mg/dL (ref >39)
LDL Chol Calc (NIH): 117 mg/dL — ABNORMAL HIGH (ref 0–99)
Triglycerides: 84 mg/dL (ref 0–149)
VLDL Cholesterol Cal: 15 mg/dL (ref 5–40)

## 2022-11-24 LAB — COMPREHENSIVE METABOLIC PANEL
ALT: 17 IU/L (ref 0–32)
AST: 17 IU/L (ref 0–40)
Albumin/Globulin Ratio: 1.4 (ref 1.2–2.2)
Albumin: 4 g/dL (ref 3.9–4.9)
Alkaline Phosphatase: 65 IU/L (ref 44–121)
BUN/Creatinine Ratio: 18 (ref 12–28)
BUN: 16 mg/dL (ref 8–27)
Bilirubin Total: 0.6 mg/dL (ref 0.0–1.2)
CO2: 25 mmol/L (ref 20–29)
Calcium: 9.7 mg/dL (ref 8.7–10.3)
Chloride: 105 mmol/L (ref 96–106)
Creatinine, Ser: 0.87 mg/dL (ref 0.57–1.00)
Globulin, Total: 2.8 g/dL (ref 1.5–4.5)
Glucose: 104 mg/dL — ABNORMAL HIGH (ref 70–99)
Potassium: 4.3 mmol/L (ref 3.5–5.2)
Sodium: 143 mmol/L (ref 134–144)
Total Protein: 6.8 g/dL (ref 6.0–8.5)
eGFR: 72 mL/min/{1.73_m2} (ref >59)

## 2022-11-25 ENCOUNTER — Other Ambulatory Visit: Payer: Self-pay | Admitting: Family Medicine

## 2022-11-25 DIAGNOSIS — E7800 Pure hypercholesterolemia, unspecified: Secondary | ICD-10-CM

## 2022-11-25 MED ORDER — ROSUVASTATIN CALCIUM 5 MG PO TABS
5.0000 mg | ORAL_TABLET | Freq: Every day | ORAL | 1 refills | Status: AC
Start: 2022-11-25 — End: ?

## 2022-12-15 ENCOUNTER — Other Ambulatory Visit: Payer: Self-pay | Admitting: Family Medicine

## 2022-12-15 DIAGNOSIS — E669 Obesity, unspecified: Secondary | ICD-10-CM

## 2022-12-15 NOTE — Telephone Encounter (Signed)
Requested Prescriptions  Pending Prescriptions Disp Refills   topiramate (TOPAMAX) 50 MG tablet [Pharmacy Med Name: TOPIRAMATE 50 MG TABLET] 90 tablet 1    Sig: 1/2 TABLET EVERY MORNING FOR 14 DAYS, THEN INCREASE TO 1 TABLET EVERY MORNING     Neurology: Anticonvulsants - topiramate & zonisamide Passed - 12/15/2022  9:32 AM      Passed - Cr in normal range and within 360 days    Creatinine, Ser  Date Value Ref Range Status  11/23/2022 0.87 0.57 - 1.00 mg/dL Final         Passed - CO2 in normal range and within 360 days    CO2  Date Value Ref Range Status  11/23/2022 25 20 - 29 mmol/L Final         Passed - ALT in normal range and within 360 days    ALT  Date Value Ref Range Status  11/23/2022 17 0 - 32 IU/L Final         Passed - AST in normal range and within 360 days    AST  Date Value Ref Range Status  11/23/2022 17 0 - 40 IU/L Final         Passed - Completed PHQ-2 or PHQ-9 in the last 360 days      Passed - Valid encounter within last 12 months    Recent Outpatient Visits           3 weeks ago Primary hypertension   [place] [facility] Malva Limes, MD   6 months ago Primary hypertension   [place] [facility] Malva Limes, MD   9 months ago Primary hypertension   [place] [facility] Malva Limes, MD   1 year ago Primary hypertension   [place] [facility] Malva Limes, MD   1 year ago Chronic left shoulder pain   [place] [facility] Chrismon, Jodell Cipro, PA-C       Future Appointments             In 1 week Fisher, Demetrios Isaacs, MD [facility]

## 2022-12-23 ENCOUNTER — Encounter: Payer: Self-pay | Admitting: Family Medicine

## 2022-12-23 ENCOUNTER — Ambulatory Visit (INDEPENDENT_AMBULATORY_CARE_PROVIDER_SITE_OTHER): Payer: Medicare PPO | Admitting: Family Medicine

## 2022-12-23 VITALS — BP 128/72 | HR 70 | Wt 173.0 lb

## 2022-12-23 DIAGNOSIS — E669 Obesity, unspecified: Secondary | ICD-10-CM

## 2022-12-23 DIAGNOSIS — I10 Essential (primary) hypertension: Secondary | ICD-10-CM | POA: Diagnosis not present

## 2022-12-23 MED ORDER — TOPIRAMATE 50 MG PO TABS: ORAL_TABLET | ORAL | Status: DC

## 2022-12-23 MED ORDER — PHENTERMINE HCL 15 MG PO CAPS
15.0000 mg | ORAL_CAPSULE | ORAL | 3 refills | Status: DC
Start: 2022-12-23 — End: 2023-03-27

## 2022-12-23 NOTE — Patient Instructions (Signed)
.   Please review the attached list of medications and notify my office if there are any errors.   . Please bring all of your medications to every appointment so we can make sure that our medication list is the same as yours.   

## 2022-12-23 NOTE — Progress Notes (Signed)
Established patient visit   Patient: Beverly Bush   DOB: 12-07-1950   72 y.o. Female  MRN: [id number] Visit Date: 12/23/2022  Today's healthcare provider: Mila Merry, MD    Subjective    HPI  Presents today to follow up on obesity since starting phentermine and topiramate after last visit 11/23/2022. She felt a little strange the first several days and has since starting taking the topiramate in the evenings, and now tolerating well with no side effects.   Wt Readings from Last 3 Encounters:  12/23/22 173 lb (78.5 kg)  11/23/22 182 lb (82.6 kg)  11/15/22 180 lb (81.6 kg)     Medications: Outpatient Medications Prior to Visit  Medication Sig   amLODipine (NORVASC) 2.5 MG tablet TAKE 1 TABLET EVERY DAY   celecoxib (CELEBREX) 100 MG capsule Take 1 capsule (100 mg total) by mouth 2 (two) times daily as needed.   Ergocalciferol (VITAMIN D2) 2000 UNITS TABS Take 1 tablet by mouth daily.   fluticasone (FLONASE) 50 MCG/ACT nasal spray SPRAY 2 SPRAYS INTO EACH NOSTRIL EVERY DAY   Multiple Vitamin (MULTIVITAMIN ADULT PO) Take 1 tablet by mouth daily.   phentermine 15 MG capsule Take 1 capsule (15 mg total) by mouth every morning.   rosuvastatin (CRESTOR) 5 MG tablet Take 1 tablet (5 mg total) by mouth daily.   topiramate (TOPAMAX) 50 MG tablet 1/2 TABLET EVERY MORNING FOR 14 DAYS, THEN INCREASE TO 1 TABLET EVERY MORNING   valsartan-hydrochlorothiazide (DIOVAN-HCT) 160-25 MG tablet Take 1 tablet by mouth daily.   No facility-administered medications prior to visit.    Review of Systems  Constitutional:  Negative for appetite change, chills, fatigue and fever.  Respiratory:  Negative for chest tightness and shortness of breath.   Cardiovascular:  Negative for chest pain and palpitations.  Gastrointestinal:  Negative for abdominal pain, nausea and vomiting.  Neurological:  Negative for dizziness and weakness.       Objective    BP 128/72 (BP Location: Left Arm, Patient  Position: Sitting, Cuff Size: Normal)   Pulse 70   Wt 173 lb (78.5 kg)   BMI 31.64 kg/m    Physical Exam  General appearance: Obese female, cooperative and in no acute distress Head: Normocephalic, without obvious abnormality, atraumatic Respiratory: Respirations even and unlabored, normal respiratory rate Extremities: All extremities are intact.  Skin: Skin color, texture, turgor normal. No rashes seen  Psych: Appropriate mood and affect. Neurologic: Mental status: Alert, oriented to person, place, and time, thought content appropriate.   Assessment & Plan     1. Obesity (BMI 30-39.9) 9 pound weight loss measured since starting topiramate and phentermine 1 months ago. Tolerating well   Last recorded height was 5\' 2" . Weight goal for a BMI of 27 is 147.6 pounds. Last recorded height was 5\' 2" . Weight goal for a BMI of 30 is 164.0 pounds.  - topiramate (TOPAMAX) 50 MG tablet; Take one tablet every evening - phentermine 15 MG capsule; Take 1 capsule (15 mg total) by mouth every morning.  Dispense: 30 capsule; Refill: 3  2. Primary hypertension Well controlled even since adding phentermine. Continue current medications.    Future Appointments  Date Time Provider Department Center  03/27/2023 11:00 AM Malva Limes, MD [facility]  11/20/2023  9:15 AM [facility]NUAL WELLNESS VISIT [facility]            Mila Merry, MD  [facility] [phone] (phone) [phone] (fax)  [facility]

## 2023-02-22 ENCOUNTER — Other Ambulatory Visit: Payer: Self-pay | Admitting: Family Medicine

## 2023-02-22 DIAGNOSIS — I10 Essential (primary) hypertension: Secondary | ICD-10-CM

## 2023-03-27 ENCOUNTER — Encounter: Payer: Self-pay | Admitting: Family Medicine

## 2023-03-27 ENCOUNTER — Ambulatory Visit (INDEPENDENT_AMBULATORY_CARE_PROVIDER_SITE_OTHER): Payer: Medicare PPO | Admitting: Family Medicine

## 2023-03-27 VITALS — BP 134/72 | HR 70 | Ht 62.0 in | Wt 176.6 lb

## 2023-03-27 DIAGNOSIS — G4733 Obstructive sleep apnea (adult) (pediatric): Secondary | ICD-10-CM

## 2023-03-27 DIAGNOSIS — I10 Essential (primary) hypertension: Secondary | ICD-10-CM

## 2023-03-27 DIAGNOSIS — E669 Obesity, unspecified: Secondary | ICD-10-CM | POA: Diagnosis not present

## 2023-03-27 MED ORDER — PHENTERMINE HCL 15 MG PO CAPS
15.0000 mg | ORAL_CAPSULE | ORAL | 3 refills | Status: DC
Start: 2023-03-27 — End: 2023-07-17

## 2023-03-27 NOTE — Progress Notes (Signed)
Established patient visit   Patient: Beverly Bush   DOB: 1951-05-17   72 y.o. Female  MRN: [id number] Visit Date: 03/27/2023  Today's healthcare provider: Mila Merry, MD   Chief Complaint  Patient presents with   Medical Management of Chronic Issues    Patient is present for 3 month follow-up.   Subjective    HPI Discussed the use of AI scribe software for clinical note transcription with the patient, who gave verbal consent to proceed.  History of Present Illness   The patient, Beverly Bush, has been prescribed phentermine and topiramate for weight loss. However, she reports not having taken these medications for about a month due to running out of the phentermine and an error with the prescription. Prior to discontinuation, the patient noticed a decrease in appetite and managed to lose a few pounds. She has not noticed any side effects from these medications.  The patient also reports occasional palpitations, which occurred last week. She had started taking turmeric supplements around the same time and wondered if there was a connection, but the palpitations resolved after discontinuing the supplement. She denies any chest pain or shortness of breath.  The patient has been prescribed blood pressure medication, which she reports taking regularly. She does not check her blood pressure frequently at home, but when she does, it is usually normal.  The patient also uses a CPAP machine for sleep apnea. She expresses a desire for a new mask that only covers the nose, as the current full-face mask leaves marks on her face. She reports not having received any replacement parts for the machine in about a year.  Lastly, the patient mentions having a full bottle of topiramate at home, which she has not been taking regularly. She plans to skip the refill this time.      Medications: Outpatient Medications Prior to Visit  Medication Sig   amLODipine (NORVASC) 2.5 MG tablet TAKE 1  TABLET EVERY DAY   celecoxib (CELEBREX) 100 MG capsule Take 1 capsule (100 mg total) by mouth 2 (two) times daily as needed.   Ergocalciferol (VITAMIN D2) 2000 UNITS TABS Take 1 tablet by mouth daily.   fluticasone (FLONASE) 50 MCG/ACT nasal spray SPRAY 2 SPRAYS INTO EACH NOSTRIL EVERY DAY   Multiple Vitamin (MULTIVITAMIN ADULT PO) Take 1 tablet by mouth daily.   phentermine 15 MG capsule Take 1 capsule (15 mg total) by mouth every morning.   rosuvastatin (CRESTOR) 5 MG tablet Take 1 tablet (5 mg total) by mouth daily.   topiramate (TOPAMAX) 50 MG tablet Take one tablet every evening   valsartan-hydrochlorothiazide (DIOVAN-HCT) 160-25 MG tablet Take 1 tablet by mouth daily.   No facility-administered medications prior to visit.    Last metabolic panel Lab Results  Component Value Date   GLUCOSE 104 (H) 11/23/2022   NA 143 11/23/2022   K 4.3 11/23/2022   CL 105 11/23/2022   CO2 25 11/23/2022   BUN 16 11/23/2022   CREATININE 0.87 11/23/2022   EGFR 72 11/23/2022   CALCIUM 9.7 11/23/2022   PROT 6.8 11/23/2022   ALBUMIN 4.0 11/23/2022   LABGLOB 2.8 11/23/2022   AGRATIO 1.4 11/23/2022   BILITOT 0.6 11/23/2022   ALKPHOS 65 11/23/2022   AST 17 11/23/2022   ALT 17 11/23/2022        Objective    BP 134/72   Pulse 70   Ht 5\' 2"  (1.575 m)   Wt 176 lb 9.6 oz (80.1 kg)  BMI 32.30 kg/m  Wt Readings from Last 3 Encounters:  03/27/23 176 lb 9.6 oz (80.1 kg)  12/23/22 173 lb (78.5 kg)  11/23/22 182 lb (82.6 kg)      Physical Exam   General: Appearance:    Mildly obese female in no acute distress  Eyes:    PERRL, conjunctiva/corneas clear, EOM's intact       Lungs:     Clear to auscultation bilaterally, respirations unlabored  Heart:    Normal heart rate. Normal rhythm. No murmurs, rubs, or gallops.    MS:   All extremities are intact.    Neurologic:   Awake, alert, oriented x 3. No apparent focal neurological defect.         Assessment & Plan     Assessment and Plan     Weight Management Patient has been off phentermine for a month due to prescription issues. Reported decreased appetite and weight loss while on the medication. No side effects noted. -Resend prescription for phentermine to [facility].  Hypertension Patient reports blood pressure readings at home have been normal. No symptoms of chest pain, shortness of breath, or palpitations. -Continue current blood pressure medications.  Cardiac Palpitations Patient reported palpitations last week, was not taking phentermine at that time, possibly related to caffeine or vitamin intake. No current symptoms. -Advised to monitor caffeine intake.  CPAP Equipment Patient reports needing a new nasal piece for CPAP machine. Previously used [employer] for supplies -Investigate new supplier for CPAP equipment and arrange for replacement parts.  Follow-up in 3-4 months to monitor blood sugar and other health parameters.           Mila Merry, MD  [facility] [phone] (phone) [phone] (fax)  [facility]

## 2023-03-27 NOTE — Patient Instructions (Signed)
.   Please review the attached list of medications and notify my office if there are any errors.   . Please bring all of your medications to every appointment so we can make sure that our medication list is the same as yours.   

## 2023-04-29 ENCOUNTER — Other Ambulatory Visit: Payer: Self-pay | Admitting: Family Medicine

## 2023-04-29 DIAGNOSIS — I10 Essential (primary) hypertension: Secondary | ICD-10-CM

## 2023-05-01 DIAGNOSIS — L821 Other seborrheic keratosis: Secondary | ICD-10-CM | POA: Diagnosis not present

## 2023-05-01 DIAGNOSIS — D0472 Carcinoma in situ of skin of left lower limb, including hip: Secondary | ICD-10-CM | POA: Diagnosis not present

## 2023-05-01 DIAGNOSIS — D492 Neoplasm of unspecified behavior of bone, soft tissue, and skin: Secondary | ICD-10-CM | POA: Diagnosis not present

## 2023-05-01 DIAGNOSIS — L738 Other specified follicular disorders: Secondary | ICD-10-CM | POA: Diagnosis not present

## 2023-05-19 ENCOUNTER — Other Ambulatory Visit: Payer: Self-pay | Admitting: Family Medicine

## 2023-05-19 DIAGNOSIS — E7800 Pure hypercholesterolemia, unspecified: Secondary | ICD-10-CM

## 2023-05-22 NOTE — Telephone Encounter (Signed)
Requested Prescriptions  Pending Prescriptions Disp Refills   rosuvastatin (CRESTOR) 5 MG tablet [Pharmacy Med Name: Rosuvastatin Calcium Oral Tablet 5 MG] 90 tablet 0    Sig: TAKE 1 TABLET EVERY DAY     Cardiovascular:  Antilipid - Statins 2 Failed - 05/19/2023 11:36 PM      Failed - Lipid Panel in normal range within the last 12 months    Cholesterol, Total  Date Value Ref Range Status  11/23/2022 217 (H) 100 - 199 mg/dL Final   LDL Chol Calc (NIH)  Date Value Ref Range Status  11/23/2022 117 (H) 0 - 99 mg/dL Final   HDL  Date Value Ref Range Status  11/23/2022 85 >39 mg/dL Final   Triglycerides  Date Value Ref Range Status  11/23/2022 84 0 - 149 mg/dL Final         Passed - Cr in normal range and within 360 days    Creatinine, Ser  Date Value Ref Range Status  11/23/2022 0.87 0.57 - 1.00 mg/dL Final         Passed - Patient is not pregnant      Passed - Valid encounter within last 12 months    Recent Outpatient Visits           1 month ago Obesity (BMI 30-39.9)   [place] [facility] Malva Limes, MD   5 months ago Obesity (BMI 30-39.9)   [place] [facility] Malva Limes, MD   6 months ago Primary hypertension   [place] [facility] Malva Limes, MD   12 months ago Primary hypertension   [place] [facility] Malva Limes, MD   1 year ago Primary hypertension   [place] [facility] Malva Limes, MD       Future Appointments             In 1 month Fisher, Demetrios Isaacs, MD [facility]

## 2023-07-17 ENCOUNTER — Encounter: Payer: Self-pay | Admitting: Family Medicine

## 2023-07-17 ENCOUNTER — Ambulatory Visit (INDEPENDENT_AMBULATORY_CARE_PROVIDER_SITE_OTHER): Payer: Medicare PPO | Admitting: Family Medicine

## 2023-07-17 VITALS — BP 134/58 | HR 73 | Resp 16 | Ht 62.0 in | Wt 179.0 lb

## 2023-07-17 DIAGNOSIS — E559 Vitamin D deficiency, unspecified: Secondary | ICD-10-CM

## 2023-07-17 DIAGNOSIS — R7303 Prediabetes: Secondary | ICD-10-CM | POA: Diagnosis not present

## 2023-07-17 DIAGNOSIS — Z23 Encounter for immunization: Secondary | ICD-10-CM

## 2023-07-17 DIAGNOSIS — E7800 Pure hypercholesterolemia, unspecified: Secondary | ICD-10-CM

## 2023-07-17 DIAGNOSIS — I10 Essential (primary) hypertension: Secondary | ICD-10-CM | POA: Diagnosis not present

## 2023-07-17 DIAGNOSIS — J301 Allergic rhinitis due to pollen: Secondary | ICD-10-CM

## 2023-07-17 DIAGNOSIS — E669 Obesity, unspecified: Secondary | ICD-10-CM | POA: Diagnosis not present

## 2023-07-17 MED ORDER — PHENTERMINE HCL 15 MG PO CAPS
15.0000 mg | ORAL_CAPSULE | ORAL | 5 refills | Status: DC
Start: 2023-07-17 — End: 2024-04-15

## 2023-07-17 MED ORDER — FLUTICASONE PROPIONATE 50 MCG/ACT NA SUSP
NASAL | 2 refills | Status: AC
Start: 2023-07-17 — End: ?

## 2023-07-17 NOTE — Progress Notes (Signed)
Established patient visit   Patient: Beverly Bush   DOB: 1951/02/09   72 y.o. Female  MRN: [id number] Visit Date: 07/17/2023  Today's healthcare provider: Mila Merry, MD   No chief complaint on file.  Subjective    Discussed the use of AI scribe software for clinical note transcription with the patient, who gave verbal consent to proceed.  History of Present Illness   The patient, with a history of hypertension, obesity and hyperlipidemia, presents for a routine follow-up. She reports discontinuing phentermine and topiramate a few months ago but expresses a desire to resume the medication. She has been taking rosuvastatin without any side effects.  The patient has been experiencing nocturnal right leg pain, which wakes her up early in the morning. The pain varies in location, sometimes starting at the knee and moving up or down. The pain does not bother her during the day or when walking around. She also reports morning back stiffness that improves after a shower.  The patient has been experiencing dry mouth, particularly in the mornings, and has been using a CPAP machine. She also reports a buildup of thick mucus in the mornings, which she has to cough up. She occasionally hears wheezing or other noises in her lungs, but these symptoms typically clear up later in the day. She has not been diagnosed with asthma or any other lung conditions. She has been using Flonase to manage these symptoms.       Medications: Outpatient Medications Prior to Visit  Medication Sig   amLODipine (NORVASC) 2.5 MG tablet TAKE 1 TABLET EVERY DAY   Ergocalciferol (VITAMIN D2) 2000 UNITS TABS Take 1 tablet by mouth daily.   Multiple Vitamin (MULTIVITAMIN ADULT PO) Take 1 tablet by mouth daily.   rosuvastatin (CRESTOR) 5 MG tablet TAKE 1 TABLET EVERY DAY   topiramate (TOPAMAX) 50 MG tablet Take one tablet every evening   valsartan-hydrochlorothiazide (DIOVAN-HCT) 160-25 MG tablet TAKE 1 TABLET  EVERY DAY   [DISCONTINUED] fluticasone (FLONASE) 50 MCG/ACT nasal spray SPRAY 2 SPRAYS INTO EACH NOSTRIL EVERY DAY   [DISCONTINUED] phentermine 15 MG capsule Take 1 capsule (15 mg total) by mouth every morning.   No facility-administered medications prior to visit.   Review of Systems  Constitutional:  Negative for appetite change, chills, fatigue and fever.  Respiratory:  Negative for chest tightness and shortness of breath.   Cardiovascular:  Negative for chest pain and palpitations.  Gastrointestinal:  Negative for abdominal pain, nausea and vomiting.  Neurological:  Negative for dizziness and weakness.       Objective    BP (!) 134/58 (BP Location: Left Arm, Patient Position: Sitting, Cuff Size: Large)   Pulse 73   Resp 16   Ht 5\' 2"  (1.575 m)   Wt 179 lb (81.2 kg)   SpO2 100%   BMI 32.74 kg/m    Physical Exam   CHEST: Clear to auscultation. CARDIOVASCULAR: Normal heart sounds.      Assessment & Plan        Obesity Patient has not been taking phentermine and topiramate for a couple of months but wishes to resume. No reported side effects. -Print prescription for phentermine for patient to fill at preferred pharmacy. -Continue topiramate as previously prescribed.  Hyperlipidemia Patient is taking rosuvastatin without any reported side effects. Last LDL was 117. -Continue rosuvastatin. -Order cholesterol panel today to assess LDL control.  Leg Pain Patient reports nocturnal right leg pain that resolves upon movement. No pain  during ambulation. Morning back stiffness reported. -Suspect referred pain from back. -Advise over-the-counter Aleve as needed for nocturnal pain.  Respiratory Symptoms Patient reports morning dry mouth, thick mucus, and occasional wheezing. No history of asthma. -Prescribe Flonase to alleviate symptoms.          Mila Merry, MD  [facility] [phone] (phone) [phone] (fax)  [facility]

## 2023-07-18 LAB — COMPREHENSIVE METABOLIC PANEL
ALT: 15 [IU]/L (ref 0–32)
AST: 18 [IU]/L (ref 0–40)
Albumin: 3.9 g/dL (ref 3.9–4.9)
Alkaline Phosphatase: 64 [IU]/L (ref 44–121)
BUN/Creatinine Ratio: 19 (ref 12–28)
BUN: 16 mg/dL (ref 8–27)
Bilirubin Total: 0.7 mg/dL (ref 0.0–1.2)
CO2: 23 mmol/L (ref 20–29)
Calcium: 9.8 mg/dL (ref 8.7–10.3)
Chloride: 106 mmol/L (ref 96–106)
Creatinine, Ser: 0.86 mg/dL (ref 0.57–1.00)
Globulin, Total: 2.9 g/dL (ref 1.5–4.5)
Glucose: 109 mg/dL — ABNORMAL HIGH (ref 70–99)
Potassium: 4.2 mmol/L (ref 3.5–5.2)
Sodium: 144 mmol/L (ref 134–144)
Total Protein: 6.8 g/dL (ref 6.0–8.5)
eGFR: 73 mL/min/{1.73_m2} (ref >59)

## 2023-07-18 LAB — LIPID PANEL
Chol/HDL Ratio: 1.8 {ratio} (ref 0.0–4.4)
Cholesterol, Total: 161 mg/dL (ref 100–199)
HDL: 88 mg/dL (ref >39)
LDL Chol Calc (NIH): 62 mg/dL (ref 0–99)
Triglycerides: 52 mg/dL (ref 0–149)
VLDL Cholesterol Cal: 11 mg/dL (ref 5–40)

## 2023-07-18 LAB — HEMOGLOBIN A1C
Est. average glucose Bld gHb Est-mCnc: 134 mg/dL
Hgb A1c MFr Bld: 6.3 % — ABNORMAL HIGH (ref 4.8–5.6)

## 2023-07-19 ENCOUNTER — Other Ambulatory Visit: Payer: Self-pay | Admitting: Family Medicine

## 2023-07-19 DIAGNOSIS — Z1231 Encounter for screening mammogram for malignant neoplasm of breast: Secondary | ICD-10-CM

## 2023-08-17 ENCOUNTER — Ambulatory Visit
Admission: RE | Admit: 2023-08-17 | Discharge: 2023-08-17 | Disposition: A | Discharge status: Home / Self Care | Payer: Medicare PPO | Source: Ambulatory Visit | Attending: Family Medicine | Admitting: Family Medicine

## 2023-08-17 DIAGNOSIS — Z1231 Encounter for screening mammogram for malignant neoplasm of breast: Secondary | ICD-10-CM | POA: Diagnosis not present

## 2023-09-06 ENCOUNTER — Other Ambulatory Visit: Payer: Self-pay | Admitting: Family Medicine

## 2023-09-06 DIAGNOSIS — E7800 Pure hypercholesterolemia, unspecified: Secondary | ICD-10-CM

## 2023-09-06 MED ORDER — ROSUVASTATIN CALCIUM 5 MG PO TABS: 5.0000 mg | ORAL_TABLET | Freq: Every day | ORAL | 0 refills | Status: DC

## 2023-09-27 ENCOUNTER — Ambulatory Visit: Payer: Medicare PPO | Admitting: Emergency Medicine

## 2023-09-27 VITALS — Ht 62.0 in | Wt 171.0 lb

## 2023-09-27 DIAGNOSIS — Z0000 Encounter for general adult medical examination without abnormal findings: Secondary | ICD-10-CM

## 2023-09-27 NOTE — Progress Notes (Signed)
 Subjective:   Beverly Bush is a 73 y.o. female who presents for Medicare Annual (Subsequent) preventive examination.  This patient declined Interactive audio and acupuncturist. Therefore the visit was completed with audio only.   Visit Complete: Virtual I connected with  Beverly Bush on 09/27/23 by a audio enabled telemedicine application and verified that I am speaking with the correct person using two identifiers.  Patient Location: Home  Provider Location: Home Office  I discussed the limitations of evaluation and management by telemedicine. The patient expressed understanding and agreed to proceed.  Vital Signs: Because this visit was a virtual/telehealth visit, some criteria may be missing or patient reported. Any vitals not documented were not able to be obtained and vitals that have been documented are patient reported.   Cardiac Risk Factors include: advanced age (>80men, >67 women);hypertension;dyslipidemia;obesity (BMI >30kg/m2);Other (see comment), Risk factor comments: OSA (cpap), prediabetic     Objective:    Today's Vitals   09/27/23 1010  Weight: 171 lb (77.6 kg)  Height: 5' 2 (1.575 m)   Body mass index is 31.28 kg/m.     09/27/2023   10:21 AM 11/15/2022    9:39 AM 11/10/2021   11:09 AM 05/07/2018    8:11 AM  Advanced Directives  Does Patient Have a Medical Advance Directive? No No No No  Would patient like information on creating a medical advance directive? Yes (MAU/Ambulatory/Procedural Areas - Information given)  No - Patient declined     Current Medications (verified) Outpatient Encounter Medications as of 09/27/2023  Medication Sig   amLODipine  (NORVASC ) 2.5 MG tablet TAKE 1 TABLET EVERY DAY   Ergocalciferol  (VITAMIN D2) 2000 UNITS TABS Take 1 tablet by mouth daily.   fluticasone  (FLONASE ) 50 MCG/ACT nasal spray SPRAY 2 SPRAYS INTO EACH NOSTRIL EVERY DAY   Multiple Vitamin (MULTIVITAMIN ADULT PO) Take 1 tablet by mouth daily.    phentermine  15 MG capsule Take 1 capsule (15 mg total) by mouth every morning.   rosuvastatin  (CRESTOR ) 5 MG tablet Take 1 tablet (5 mg total) by mouth daily.   topiramate  (TOPAMAX ) 50 MG tablet Take one tablet every evening   valsartan -hydrochlorothiazide  (DIOVAN -HCT) 160-25 MG tablet TAKE 1 TABLET EVERY DAY   No facility-administered encounter medications on file as of 09/27/2023.    Allergies (verified) Patient has no known allergies.   History: Past Medical History:  Diagnosis Date   Sleep apnea    Past Surgical History:  Procedure Laterality Date   ABDOMINAL HYSTERECTOMY  2008   Due to heavy bleeding. Complete; does not have ovaries per patient   COLONOSCOPY WITH PROPOFOL  N/A 05/07/2018   Procedure: COLONOSCOPY WITH PROPOFOL ;  Surgeon: Viktoria Lamar DASEN, MD;  Location: [facility] ENDOSCOPY;  Service: Endoscopy;  Laterality: N/A;   Family History  Problem Relation Age of Onset   Diabetes Mother        non insulin dependant diabetes mellitus   Hypertension Father    Cancer Sister        breast   Breast cancer Sister    Cancer Sister        colon cancer   Breast cancer Sister    Breast cancer Paternal Aunt    Social History   Socioeconomic History   Marital status: Married    Spouse name: Bertrum   Number of children: 2   Years of education: College Gr   Highest education level: Not on file  Occupational History   Occupation: retired  Tobacco Use  Smoking status: Never   Smokeless tobacco: Never  Vaping Use   Vaping status: Never Used  Substance and Sexual Activity   Alcohol use: No   Drug use: No   Sexual activity: Not Currently  Other Topics Concern   Not on file  Social History Narrative   Not on file   Social Drivers of Health   Financial Resource Strain: Low Risk  (09/27/2023)   Overall Financial Resource Strain (CARDIA)    Difficulty of Paying Living Expenses: Not hard at all  Food Insecurity: No Food Insecurity (09/27/2023)   Hunger Vital Sign     Worried About Running Out of Food in the Last Year: Never true    Ran Out of Food in the Last Year: Never true  Transportation Needs: No Transportation Needs (09/27/2023)   PRAPARE - Administrator, Civil Service (Medical): No    Lack of Transportation (Non-Medical): No  Physical Activity: Inactive (09/27/2023)   Exercise Vital Sign    Days of Exercise per Week: 0 days    Minutes of Exercise per Session: 0 min  Stress: No Stress Concern Present (09/27/2023)   [employer] of Occupational Health - Occupational Stress Questionnaire    Feeling of Stress : Not at all  Social Connections: Socially Integrated (09/27/2023)   Social Connection and Isolation Panel [NHANES]    Frequency of Communication with Friends and Family: More than three times a week    Frequency of Social Gatherings with Friends and Family: More than three times a week    Attends Religious Services: More than 4 times per year    Active Member of [employer] or Organizations: Yes    Attends Engineer, Structural: More than 4 times per year    Marital Status: Married    Tobacco Counseling Counseling given: Not Answered   Clinical Intake:  Pre-visit preparation completed: Yes  Pain : No/denies pain     BMI - recorded: 31.28 Nutritional Status: BMI > 30  Obese Nutritional Risks: None Diabetes: No  How often do you need to have someone help you when you read instructions, pamphlets, or other written materials from your doctor or pharmacy?: 1 - Never  Interpreter Needed?: No  Information entered by :: Vina Ned, CMA   Activities of Daily Living    09/27/2023   10:12 AM 11/14/2022   10:54 PM  In your present state of health, do you have any difficulty performing the following activities:  Hearing? 0 0  Vision? 0 0  Difficulty concentrating or making decisions? 0 0  Walking or climbing stairs? 0 0  Dressing or bathing? 0 0  Doing errands, shopping? 0 0  Preparing Food and eating ? N N  Using  the Toilet? N N  In the past six months, have you accidently leaked urine? N N  Do you have problems with loss of bowel control? N N  Managing your Medications? N N  Managing your Finances? N N  Housekeeping or managing your Housekeeping? N N    Patient Care Team: Gasper Nancyann BRAVO, MD as PCP - General (Family Medicine) [facility], [place]  Indicate any recent Medical Services you may have received from other than [facility] providers in the past year (date may be approximate).     Assessment:   This is a routine wellness examination for Dearia.  Hearing/Vision screen Hearing Screening - Comments:: Denies hearing loss Vision Screening - Comments:: Gets eye exams, the eye center [facility] or  [facility] [place] [facility]   Goals Addressed               This Visit's Progress     Weight (lb) < 160 lb (72.6 kg) (pt-stated)   171 lb (77.6 kg)     Depression Screen    09/27/2023   10:17 AM 03/27/2023   10:53 AM 11/15/2022    9:38 AM 05/25/2022    9:58 AM 11/10/2021   11:07 AM 03/19/2021    9:36 AM 11/17/2020    3:04 PM  PHQ 2/9 Scores  PHQ - 2 Score 3 0 0 0 0 1 0  PHQ- 9 Score 5   0  2 4    Fall Risk    09/27/2023   10:23 AM 03/27/2023   10:53 AM 11/14/2022   10:54 PM 05/25/2022    9:58 AM 11/10/2021   11:10 AM  Fall Risk   Falls in the past year? 0 0  0 0  Number falls in past yr: 0  0 0 0  Injury with Fall? 0 0 0 0 0  Risk for fall due to : No Fall Risks No Fall Risks  No Fall Risks No Fall Risks  Follow up Falls prevention discussed;Falls evaluation completed Falls evaluation completed  Falls evaluation completed Falls evaluation completed    MEDICARE RISK AT HOME: Medicare Risk at Home Any stairs in or around the home?: No If so, are there any without handrails?: No Home free of loose throw rugs in walkways, pet beds, electrical cords, etc?: Yes Adequate lighting in your home to reduce risk of falls?: Yes Life alert?: No Use of a cane, walker or w/c?: No Grab bars in  the bathroom?: No Shower chair or bench in shower?: No Elevated toilet seat or a handicapped toilet?: No  TIMED UP AND GO:  Was the test performed?  No    Cognitive Function:        09/27/2023   10:24 AM 11/15/2022    9:25 AM  6CIT Screen  What Year? 0 points 0 points  What month? 0 points 0 points  What time? 0 points 0 points  Count back from 20 0 points 0 points  Months in reverse 0 points 0 points  Repeat phrase 0 points 2 points  Total Score 0 points 2 points    Immunizations Immunization History  Administered Date(s) Administered   Fluad Quad(high Dose 65+) 07/21/2021, 05/25/2022   Fluad Trivalent(High Dose 65+) 07/17/2023   Influenza, High Dose Seasonal PF 06/13/2018, 08/25/2020   Influenza,inj,Quad PF,6+ Mos 05/27/2015, 06/28/2016, 07/23/2019   PFIZER Comirnaty(Gray Top)Covid-19 Tri-Sucrose Vaccine 04/21/2021   PFIZER(Purple Top)SARS-COV-2 Vaccination 09/02/2019, 09/23/2019, 08/10/2020   PNEUMOCOCCAL CONJUGATE-20 05/25/2022   Pneumococcal Polysaccharide-23 12/17/2019   Td 04/09/2018   Tdap 04/22/2008   Zoster Recombinant(Shingrix ) 04/09/2018, 06/13/2018   Zoster, Live 09/23/2014    TDAP status: Up to date  Flu Vaccine status: Up to date  Pneumococcal vaccine status: Up to date  Covid-19 vaccine status: Declined, Education has been provided regarding the importance of this vaccine but patient still declined. Advised may receive this vaccine at local pharmacy or Health Dept.or vaccine clinic. Aware to provide a copy of the vaccination record if obtained from local pharmacy or Health Dept. Verbalized acceptance and understanding.  Qualifies for Shingles Vaccine? Yes   Zostavax completed Yes   Shingrix  Completed?: Yes  Screening Tests Health Maintenance  Topic Date Due   COVID-19 Vaccine (5 - 2024-25 season) 04/23/2023   Medicare Annual  Wellness (AWV)  09/26/2024   DEXA SCAN  01/19/2025   MAMMOGRAM  08/16/2025   DTaP/Tdap/Td (3 - Td or Tdap) 04/09/2028    Colonoscopy  05/07/2028   Pneumonia Vaccine 44+ Years old  Completed   INFLUENZA VACCINE  Completed   Hepatitis C Screening  Completed   Zoster Vaccines- Shingrix   Completed   HPV VACCINES  Aged Out    Health Maintenance  Health Maintenance Due  Topic Date Due   COVID-19 Vaccine (5 - 2024-25 season) 04/23/2023    Colorectal cancer screening: Type of screening: Colonoscopy. Completed 05/07/18. Repeat every 5 years. Scheduled 10/12/23 for colonoscopy per patient  Mammogram status: Completed 08/17/23. Repeat every year  Bone Density status: Completed 01/19/22. Results reflect: Bone density results: OSTEOPENIA. Repeat every 3 years.  Lung Cancer Screening: (Low Dose CT Chest recommended if Age 63-80 years, 20 pack-year currently smoking OR have quit w/in 15years.) does not qualify.   Lung Cancer Screening Referral: n/a  Additional Screening:  Hepatitis C Screening: does not qualify; Completed 12/20/12  Vision Screening: Recommended annual ophthalmology exams for early detection of glaucoma and other disorders of the eye.  Dental Screening: Recommended annual dental exams for proper oral hygiene   Community Resource Referral / Chronic Care Management: CRR required this visit?  No   CCM required this visit?  No     Plan:     I have personally reviewed and noted the following in the patient's chart:   Medical and social history Use of alcohol, tobacco or illicit drugs  Current medications and supplements including opioid prescriptions. Patient is not currently taking opioid prescriptions. Functional ability and status Nutritional status Physical activity Advanced directives List of other physicians Hospitalizations, surgeries, and ER visits in previous 12 months Vitals Screenings to include cognitive, depression, and falls Referrals and appointments  In addition, I have reviewed and discussed with patient certain preventive protocols, quality metrics, and best practice  recommendations. A written personalized care plan for preventive services as well as general preventive health recommendations were provided to patient.     Vina Ned, CMA   09/27/2023   After Visit Summary: (MyChart) Due to this being a telephonic visit, the after visit summary with patients personalized plan was offered to patient via MyChart   Nurse Notes:  Declined covid vaccine Patient states she has a colonoscopy scheduled for 10/12/23

## 2023-09-27 NOTE — Patient Instructions (Addendum)
 [name] , Thank you for taking time to come for your Medicare Wellness Visit. I appreciate your ongoing commitment to your health goals. Please review the following plan we discussed and let me know if I can assist you in the future.   Referrals/Orders/Follow-Ups/Clinician Recommendations: Keep up the good work!!  This is a list of the screening recommended for you and due dates:  Health Maintenance  Topic Date Due   COVID-19 Vaccine (5 - 2024-25 season) 04/23/2023   Colon Cancer Screening  05/08/2023   Mammogram  08/16/2024   Medicare Annual Wellness Visit  09/26/2024   DEXA scan (bone density measurement)  01/19/2025   DTaP/Tdap/Td vaccine (3 - Td or Tdap) 04/09/2028   Pneumonia Vaccine  Completed   Flu Shot  Completed   Hepatitis C Screening  Completed   Zoster (Shingles) Vaccine  Completed   HPV Vaccine  Aged Out    Advanced directives: (ACP Link)Information on Advanced Care Planning can be found at [employer] of State Advance Health Care Directives Advance Health Care Directives ([email or web address]/) You may also get the forms at your doctor's office.  Once you have complete the forms, please bring a copy of your health care power of attorney and living will to the office to be added to your chart at your convenience.   Next Medicare Annual Wellness Visit scheduled for next year: Yes, 10/02/24 @ 10:10am (video visit)

## 2023-10-12 DIAGNOSIS — Z800 Family history of malignant neoplasm of digestive organs: Secondary | ICD-10-CM | POA: Diagnosis not present

## 2023-10-12 DIAGNOSIS — G4733 Obstructive sleep apnea (adult) (pediatric): Secondary | ICD-10-CM | POA: Diagnosis not present

## 2023-11-21 ENCOUNTER — Encounter: Payer: Self-pay | Admitting: Gastroenterology

## 2023-11-24 ENCOUNTER — Encounter: Payer: Self-pay | Admitting: Gastroenterology

## 2023-11-30 ENCOUNTER — Encounter: Payer: Self-pay | Admitting: Gastroenterology

## 2023-12-01 ENCOUNTER — Ambulatory Visit: Payer: Self-pay | Admitting: Certified Registered"

## 2023-12-01 ENCOUNTER — Ambulatory Visit
Admission: RE | Admit: 2023-12-01 | Discharge: 2023-12-01 | Disposition: A | Discharge status: Home / Self Care | Payer: Medicare PPO | Attending: Gastroenterology | Admitting: Gastroenterology

## 2023-12-01 ENCOUNTER — Encounter
Admission: RE | Disposition: A | Discharge status: Home / Self Care | Payer: Self-pay | Source: Home / Self Care | Attending: Gastroenterology

## 2023-12-01 ENCOUNTER — Other Ambulatory Visit: Payer: Self-pay

## 2023-12-01 ENCOUNTER — Encounter: Payer: Self-pay | Admitting: Gastroenterology

## 2023-12-01 DIAGNOSIS — K621 Rectal polyp: Secondary | ICD-10-CM | POA: Insufficient documentation

## 2023-12-01 DIAGNOSIS — E669 Obesity, unspecified: Secondary | ICD-10-CM | POA: Diagnosis not present

## 2023-12-01 DIAGNOSIS — Q438 Other specified congenital malformations of intestine: Secondary | ICD-10-CM | POA: Insufficient documentation

## 2023-12-01 DIAGNOSIS — R7303 Prediabetes: Secondary | ICD-10-CM | POA: Diagnosis not present

## 2023-12-01 DIAGNOSIS — Z800 Family history of malignant neoplasm of digestive organs: Secondary | ICD-10-CM | POA: Insufficient documentation

## 2023-12-01 DIAGNOSIS — Z1211 Encounter for screening for malignant neoplasm of colon: Secondary | ICD-10-CM | POA: Diagnosis not present

## 2023-12-01 DIAGNOSIS — G4730 Sleep apnea, unspecified: Secondary | ICD-10-CM | POA: Insufficient documentation

## 2023-12-01 DIAGNOSIS — D122 Benign neoplasm of ascending colon: Secondary | ICD-10-CM | POA: Diagnosis not present

## 2023-12-01 DIAGNOSIS — I10 Essential (primary) hypertension: Secondary | ICD-10-CM | POA: Diagnosis not present

## 2023-12-01 DIAGNOSIS — Z6831 Body mass index (BMI) 31.0-31.9, adult: Secondary | ICD-10-CM | POA: Insufficient documentation

## 2023-12-01 DIAGNOSIS — K640 First degree hemorrhoids: Secondary | ICD-10-CM | POA: Diagnosis not present

## 2023-12-01 HISTORY — DX: Pure hypercholesterolemia, unspecified: E78.00

## 2023-12-01 HISTORY — DX: Essential (primary) hypertension: I10

## 2023-12-01 SURGERY — COLONOSCOPY WITH PROPOFOL
Anesthesia: General

## 2023-12-01 MED ORDER — LABETALOL HCL 5 MG/ML IV SOLN
INTRAVENOUS | Status: AC
  Filled 2023-12-01: qty 4

## 2023-12-01 MED ORDER — PROPOFOL 500 MG/50ML IV EMUL
INTRAVENOUS | Status: DC | PRN
  Administered 2023-12-01: 100 ug/kg/min via INTRAVENOUS

## 2023-12-01 MED ORDER — PROPOFOL 10 MG/ML IV BOLUS
INTRAVENOUS | Status: AC
  Filled 2023-12-01: qty 20

## 2023-12-01 MED ORDER — SODIUM CHLORIDE 0.9 % IV SOLN: INTRAVENOUS | Status: DC

## 2023-12-01 MED ORDER — ONDANSETRON HCL 4 MG/2ML IJ SOLN
INTRAMUSCULAR | Status: DC | PRN
  Administered 2023-12-01: 4 mg via INTRAVENOUS

## 2023-12-01 MED ORDER — ONDANSETRON HCL 4 MG/2ML IJ SOLN
INTRAMUSCULAR | Status: AC
  Filled 2023-12-01: qty 2

## 2023-12-01 MED ORDER — PROPOFOL 10 MG/ML IV BOLUS
INTRAVENOUS | Status: DC | PRN
  Administered 2023-12-01: 20 mg via INTRAVENOUS
  Administered 2023-12-01: 80 mg via INTRAVENOUS
  Administered 2023-12-01: 50 mg via INTRAVENOUS

## 2023-12-01 MED ORDER — LABETALOL HCL 5 MG/ML IV SOLN
INTRAVENOUS | Status: DC | PRN
  Administered 2023-12-01: 5 mg via INTRAVENOUS

## 2023-12-01 MED ORDER — LIDOCAINE HCL (CARDIAC) PF 100 MG/5ML IV SOSY
PREFILLED_SYRINGE | INTRAVENOUS | Status: DC | PRN
  Administered 2023-12-01: 30 mg via INTRAVENOUS

## 2023-12-01 NOTE — Op Note (Signed)
 [facility]astroenter[facility] Patient Name: Beverly Bush Procedure Date: 12/01/2023 9:52 AM MRN: [id number] Account #: [id number] Date of Birth: Jul 17, 1951 Admit Type: Outpatient Age: 73 Room: [facility] ENDO ROOM 1 Gender: Female Note Status: Finalized Instrument Name: Nelda Marseille [id number] Procedure:             Colonoscopy Indications:           Screening in patient at increased risk: Family history                         of 1st-degree relative with colorectal cancer Providers:             Trenda Moots, DO Referring MD:          Demetrios Isaacs. Sherrie Mustache, MD (Referring MD) Medicines:             Monitored Anesthesia Care Complications:         No immediate complications. Estimated blood loss:                         Minimal. Procedure:             Pre-Anesthesia Assessment:                        - Prior to the procedure, a History and Physical was                         performed, and patient medications and allergies were                         reviewed. The patient is competent. The risks and                         benefits of the procedure and the sedation options and                         risks were discussed with the patient. All questions                         were answered and informed consent was obtained.                         Patient identification and proposed procedure were                         verified by the physician, the nurse, the anesthetist                         and the technician in the endoscopy suite. Mental                         Status Examination: alert and oriented. Airway                         Examination: normal oropharyngeal airway and neck                         mobility. Respiratory Examination: clear to  auscultation. CV Examination: RRR, no murmurs, no S3                         or S4. Prophylactic Antibiotics: The patient does not                         require prophylactic antibiotics.  Prior                         Anticoagulants: The patient has taken no anticoagulant                         or antiplatelet agents. ASA Grade Assessment: II - A                         patient with mild systemic disease. After reviewing                         the risks and benefits, the patient was deemed in                         satisfactory condition to undergo the procedure. The                         anesthesia plan was to use monitored anesthesia care                         (MAC). Immediately prior to administration of                         medications, the patient was re-assessed for adequacy                         to receive sedatives. The heart rate, respiratory                         rate, oxygen saturations, blood pressure, adequacy of                         pulmonary ventilation, and response to care were                         monitored throughout the procedure. The physical                         status of the patient was re-assessed after the                         procedure.                        After obtaining informed consent, the colonoscope was                         passed under direct vision. Throughout the procedure,                         the patient's blood pressure, pulse, and oxygen  saturations were monitored continuously. The                         Colonoscope was introduced through the anus and                         advanced to the the cecum, identified by appendiceal                         orifice and ileocecal valve. The colonoscopy was                         performed without difficulty. The patient tolerated                         the procedure well. The quality of the bowel                         preparation was evaluated using the BBPS [facility] Bowel                         Preparation Scale) with scores of: Right Colon = 3,                         Transverse Colon = 3 and Left Colon = 3 (entire mucosa                          seen well with no residual staining, small fragments                         of stool or opaque liquid). The total BBPS score                         equals 9. The ileocecal valve and the appendiceal                         orifice were photographed. Rectal retroflexion was                         performed, but image capture did not record/transfer. Findings:      The perianal and digital rectal examinations were normal. Pertinent       negatives include normal sphincter tone.      A 6 to 7 mm polyp was found in the ascending colon. The polyp was       sessile. The polyp was removed with a cold snare. Resection and       retrieval were complete. Estimated blood loss was minimal.      A 1 to 2 mm polyp was found in the rectum. The polyp was sessile. The       polyp was removed with a jumbo cold forceps. Resection and retrieval       were complete. Estimated blood loss was minimal.      Non-bleeding internal hemorrhoids were found during retroflexion. The       hemorrhoids were Grade I (internal hemorrhoids that do not prolapse).      The sigmoid colon and descending colon were mildly redundant. Estimated       blood loss:  none.      The exam was otherwise without abnormality on direct and retroflexion       views. Impression:            - One 6 to 7 mm polyp in the ascending colon, removed                         with a cold snare. Resected and retrieved.                        - One 1 to 2 mm polyp in the rectum, removed with a                         jumbo cold forceps. Resected and retrieved.                        - Non-bleeding internal hemorrhoids.                        - Redundant colon.                        - The examination was otherwise normal on direct and                         retroflexion views. Recommendation:        - Patient has a contact number available for                         emergencies. The signs and symptoms of potential                          delayed complications were discussed with the patient.                         Return to normal activities tomorrow. Written                         discharge instructions were provided to the patient.                        - Discharge patient to home.                        - Resume previous diet.                        - Continue present medications.                        - No ibuprofen, naproxen, or other non-steroidal                         anti-inflammatory drugs for 5 days after polyp removal.                        - Await pathology results.                        - Repeat colonoscopy for surveillance based on  pathology results.                        - Return to referring physician as previously                         scheduled.                        - The findings and recommendations were discussed with                         the patient. Procedure Code(s):     --- Professional ---                        [phone], Colonoscopy, flexible; with removal of                         tumor(s), polyp(s), or other lesion(s) by snare                         technique                        45380, 59, Colonoscopy, flexible; with biopsy, single                         or multiple Diagnosis Code(s):     --- Professional ---                        Z80.0, Family history of malignant neoplasm of                         digestive organs                        D12.2, Benign neoplasm of ascending colon                        D12.8, Benign neoplasm of rectum                        K64.0, First degree hemorrhoids                        Q43.8, Other specified congenital malformations of                         intestine CPT copyright 2022 American Medical Association. All rights reserved. The codes documented in this report are preliminary and upon coder review may  be revised to meet current compliance requirements. Attending Participation:      I personally  performed the entire procedure. Elfredia Nevins, DO [name] DO, DO 12/01/2023 10:25:07 AM This report has been signed electronically. Number of Addenda: 0 Note Initiated On: 12/01/2023 9:52 AM Scope Withdrawal Time: 0 hours 15 minutes 36 seconds  Total Procedure Duration: 0 hours 20 minutes 51 seconds  Estimated Blood Loss:  Estimated blood loss was minimal.      [facility]

## 2023-12-01 NOTE — H&P (Signed)
 Pre-Procedure H&P   Patient ID: Beverly Bush is a 73 y.o. female.  Gastroenterology Provider: [name], DO  Referring Provider: Tawni Pummel, PA PCP: Malva Limes, MD  Date: 12/01/2023  HPI [name] is a 73 y.o. female who presents today for Colonoscopy for Family history of colon cancer and polyps .  Patient reports daily bowel movements without melena or hematochezia. Family history of colon cancer and polyps in her sister.  Also with paternal and maternal grandmothers with colon cancer  Last underwent colonoscopy in September 2019 only demonstrating internal hemorrhoids.  Also underwent colonoscopies that were negative in 2014 and 2007  Creatinine 0.86 hemoglobin 12.3 MCV 90 platelets 218,000   Past Medical History:  Diagnosis Date   Hypercholesteremia    Hypertension    Sleep apnea     Past Surgical History:  Procedure Laterality Date   ABDOMINAL HYSTERECTOMY  2008   Due to heavy bleeding. Complete; does not have ovaries per patient   COLONOSCOPY WITH PROPOFOL N/A 05/07/2018   Procedure: COLONOSCOPY WITH PROPOFOL;  Surgeon: Scot Jun, MD;  Location: [facility] ENDOSCOPY;  Service: Endoscopy;  Laterality: N/A;    Family History Sister, paternal and maternal grandmothers with colon cancer No other h/o GI disease or malignancy  Review of Systems  Constitutional:  Negative for activity change, appetite change, chills, diaphoresis, fatigue, fever and unexpected weight change.  HENT:  Negative for trouble swallowing and voice change.   Respiratory:  Negative for shortness of breath and wheezing.   Cardiovascular:  Negative for chest pain, palpitations and leg swelling.  Gastrointestinal:  Negative for abdominal distention, abdominal pain, anal bleeding, blood in stool, constipation, diarrhea, nausea, rectal pain and vomiting.  Musculoskeletal:  Negative for arthralgias and myalgias.  Skin:  Negative for color change and pallor.   Neurological:  Negative for dizziness, syncope and weakness.  Psychiatric/Behavioral:  Negative for confusion.   All other systems reviewed and are negative.    Medications No current facility-administered medications on file prior to encounter.   Current Outpatient Medications on File Prior to Encounter  Medication Sig Dispense Refill   amLODipine (NORVASC) 2.5 MG tablet TAKE 1 TABLET EVERY DAY 90 tablet 3   Ergocalciferol (VITAMIN D2) 2000 UNITS TABS Take 1 tablet by mouth daily.     Multiple Vitamin (MULTIVITAMIN ADULT PO) Take 1 tablet by mouth daily.     rosuvastatin (CRESTOR) 5 MG tablet Take 1 tablet (5 mg total) by mouth daily. 90 tablet 0   valsartan-hydrochlorothiazide (DIOVAN-HCT) 160-25 MG tablet TAKE 1 TABLET EVERY DAY 90 tablet 3   fluticasone (FLONASE) 50 MCG/ACT nasal spray SPRAY 2 SPRAYS INTO EACH NOSTRIL EVERY DAY 48 mL 2   phentermine 15 MG capsule Take 1 capsule (15 mg total) by mouth every morning. 30 capsule 5   topiramate (TOPAMAX) 50 MG tablet Take one tablet every evening      Pertinent medications related to GI and procedure were reviewed by me with the patient prior to the procedure   Current Facility-Administered Medications:    0.9 %  sodium chloride infusion, , Intravenous, Continuous, [name], DO, Last Rate: 20 mL/hr at 12/01/23 0919, New Bag at 12/01/23 0919  sodium chloride 20 mL/hr at 12/01/23 0919       No Known Allergies Allergies were reviewed by me prior to the procedure  Objective   Body mass index is 31.28 kg/m. Vitals:   12/01/23 0910  BP: (!) 144/74  Pulse: 89  Resp: 14  Temp: (!) 97.3 F (36.3 C)  TempSrc: Temporal  SpO2: 100%  Weight: 77.6 kg  Height: 5\' 2"  (1.575 m)     Physical Exam Vitals and nursing note reviewed.  Constitutional:      General: She is not in acute distress.    Appearance: Normal appearance. She is not ill-appearing, toxic-appearing or diaphoretic.  HENT:     Head: Normocephalic and  atraumatic.     Nose: Nose normal.     Mouth/Throat:     Mouth: Mucous membranes are moist.     Pharynx: Oropharynx is clear.  Eyes:     General: No scleral icterus.    Extraocular Movements: Extraocular movements intact.  Cardiovascular:     Rate and Rhythm: Normal rate and regular rhythm.     Heart sounds: Normal heart sounds. No murmur heard.    No friction rub. No gallop.  Pulmonary:     Effort: Pulmonary effort is normal. No respiratory distress.     Breath sounds: Normal breath sounds. No wheezing, rhonchi or rales.  Abdominal:     General: Bowel sounds are normal. There is no distension.     Palpations: Abdomen is soft.     Tenderness: There is no abdominal tenderness. There is no guarding or rebound.  Musculoskeletal:     Cervical back: Neck supple.     Right lower leg: No edema.     Left lower leg: No edema.  Skin:    General: Skin is warm and dry.     Coloration: Skin is not jaundiced or pale.  Neurological:     General: No focal deficit present.     Mental Status: She is alert and oriented to person, place, and time. Mental status is at baseline.  Psychiatric:        Mood and Affect: Mood normal.        Behavior: Behavior normal.        Thought Content: Thought content normal.        Judgment: Judgment normal.      Assessment:  [name] is a 73 y.o. female  who presents today for Colonoscopy for Family history of colon cancer and polyps .  Plan:  Colonoscopy with possible intervention today  Colonoscopy with possible biopsy, control of bleeding, polypectomy, and interventions as necessary has been discussed with the patient/patient representative. Informed consent was obtained from the patient/patient representative after explaining the indication, nature, and risks of the procedure including but not limited to death, bleeding, perforation, missed neoplasm/lesions, cardiorespiratory compromise, and reaction to medications. Opportunity for questions  was given and appropriate answers were provided. Patient/patient representative has verbalized understanding is amenable to undergoing the procedure.   [name], DO  [facility]  Portions of the record may have been created with voice recognition software. Occasional wrong-word or 'sound-a-like' substitutions may have occurred due to the inherent limitations of voice recognition software.  Read the chart carefully and recognize, using context, where substitutions may have occurred.

## 2023-12-01 NOTE — Interval H&P Note (Signed)
 History and Physical Interval Note: Preprocedure H&P from 12/01/23  was reviewed and there was no interval change after seeing and examining the patient.  Written consent was obtained from the patient after discussion of risks, benefits, and alternatives. Patient has consented to proceed with Colonoscopy with possible intervention   12/01/2023 9:49 AM  Beverly Bush  has presented today for surgery, with the diagnosis of Z80.0 (ICD-10-CM) - Family history of colon cancer.  The various methods of treatment have been discussed with the patient and family. After consideration of risks, benefits and other options for treatment, the patient has consented to  Procedure(s): COLONOSCOPY WITH PROPOFOL (N/A) as a surgical intervention.  The patient's history has been reviewed, patient examined, no change in status, stable for surgery.  I have reviewed the patient's chart and labs.  Questions were answered to the patient's satisfaction.     Beverly Bush

## 2023-12-01 NOTE — Anesthesia Preprocedure Evaluation (Addendum)
 Anesthesia Evaluation  Patient identified by MRN, date of birth, ID band Patient awake    Reviewed: Allergy & Precautions, NPO status , Patient's Chart, lab work & pertinent test results  History of Anesthesia Complications Negative for: history of anesthetic complications  Airway Mallampati: III   Neck ROM: Full    Dental no notable dental hx.    Pulmonary sleep apnea    Pulmonary exam normal breath sounds clear to auscultation       Cardiovascular hypertension, Normal cardiovascular exam Rhythm:Regular Rate:Normal     Neuro/Psych  PSYCHIATRIC DISORDERS  Depression    negative neurological ROS     GI/Hepatic negative GI ROS,,,  Endo/Other  Obesity; prediabetes  Renal/GU negative Renal ROS     Musculoskeletal   Abdominal   Peds  Hematology negative hematology ROS (+)   Anesthesia Other Findings   Reproductive/Obstetrics                             Anesthesia Physical Anesthesia Plan  ASA: 2  Anesthesia Plan: General   Post-op Pain Management:    Induction: Intravenous  PONV Risk Score and Plan: 3 and Propofol infusion, TIVA and Treatment may vary due to age or medical condition  Airway Management Planned: Natural Airway  Additional Equipment:   Intra-op Plan:   Post-operative Plan:   Informed Consent: I have reviewed the patients History and Physical, chart, labs and discussed the procedure including the risks, benefits and alternatives for the proposed anesthesia with the patient or authorized representative who has indicated his/her understanding and acceptance.       Plan Discussed with: CRNA  Anesthesia Plan Comments: (LMA/GETA backup discussed.  Patient consented for risks of anesthesia including but not limited to:  - adverse reactions to medications - damage to eyes, teeth, lips or other oral mucosa - nerve damage due to positioning  - sore throat or  hoarseness - damage to heart, brain, nerves, lungs, other parts of body or loss of life  Informed patient about role of CRNA in peri- and intra-operative care.  Patient voiced understanding.)       Anesthesia Quick Evaluation

## 2023-12-01 NOTE — Anesthesia Postprocedure Evaluation (Signed)
 Anesthesia Post Note  Patient: Beverly Bush  Procedure(s) Performed: COLONOSCOPY WITH PROPOFOL  Patient location during evaluation: PACU Anesthesia Type: General Level of consciousness: awake and alert, oriented and patient cooperative Pain management: pain level controlled Vital Signs Assessment: post-procedure vital signs reviewed and stable Respiratory status: spontaneous breathing, nonlabored ventilation and respiratory function stable Cardiovascular status: blood pressure returned to baseline and stable Postop Assessment: adequate PO intake Anesthetic complications: no   No notable events documented.   Last Vitals:  Vitals:   12/01/23 1025 12/01/23 1035  BP: 97/62 103/68  Pulse: 76 70  Resp: 16 16  Temp:    SpO2: 100% 99%    Last Pain:  Vitals:   12/01/23 1035  TempSrc:   PainSc: 0-No pain                 Reed Breech

## 2023-12-01 NOTE — Transfer of Care (Signed)
 Immediate Anesthesia Transfer of Care Note  Patient: Beverly Bush  Procedure(s) Performed: COLONOSCOPY WITH PROPOFOL  Patient Location: PACU  Anesthesia Type:MAC  Level of Consciousness: awake  Airway & Oxygen Therapy: Patient Spontanous Breathing  Post-op Assessment: Report given to RN and Post -op Vital signs reviewed and stable  Post vital signs: Reviewed and stable  Last Vitals:  Vitals Value Taken Time  BP 98/55 12/01/23 1026  Temp    Pulse 80 12/01/23 1026  Resp 17 12/01/23 1026  SpO2 100 % 12/01/23 1026  Vitals shown include unfiled device data.  Last Pain:  Vitals:   12/01/23 1025  TempSrc:   PainSc: 0-No pain         Complications: No notable events documented.

## 2023-12-04 ENCOUNTER — Encounter: Payer: Self-pay | Admitting: Gastroenterology

## 2023-12-04 LAB — SURGICAL PATHOLOGY

## 2023-12-14 ENCOUNTER — Other Ambulatory Visit: Payer: Self-pay | Admitting: Family Medicine

## 2023-12-14 DIAGNOSIS — E7800 Pure hypercholesterolemia, unspecified: Secondary | ICD-10-CM

## 2023-12-18 ENCOUNTER — Telehealth: Payer: Self-pay

## 2023-12-18 NOTE — Telephone Encounter (Signed)
 Copied from CRM [id number]. Topic: Clinical - Medication Question >> Dec 18, 2023 11:01 AM Leory Rands wrote: Reason for CRM: Rosie calling from [facility] is calling to request a new prescription for the patient for new CPAP supplies. Including a face mask, and nose mask. Will the patient need an appointment for a sleep study? The DME is [employer]  Cb- [phone]

## 2023-12-18 NOTE — Telephone Encounter (Signed)
 Needs office visit.

## 2023-12-22 ENCOUNTER — Encounter: Payer: Self-pay | Admitting: Family Medicine

## 2023-12-22 DIAGNOSIS — Z860101 Personal history of adenomatous and serrated colon polyps: Secondary | ICD-10-CM | POA: Insufficient documentation

## 2024-02-17 ENCOUNTER — Other Ambulatory Visit: Payer: Self-pay | Admitting: Family Medicine

## 2024-02-17 DIAGNOSIS — I10 Essential (primary) hypertension: Secondary | ICD-10-CM

## 2024-04-10 ENCOUNTER — Other Ambulatory Visit: Payer: Self-pay | Admitting: Family Medicine

## 2024-04-10 ENCOUNTER — Telehealth: Payer: Self-pay | Admitting: Family Medicine

## 2024-04-10 DIAGNOSIS — E669 Obesity, unspecified: Secondary | ICD-10-CM

## 2024-04-10 NOTE — Telephone Encounter (Signed)
 Copied from CRM #[id number]. Topic: Clinical - Prescription Issue >> Apr 10, 2024 12:53 PM Zebedee SAUNDERS wrote: Reason for CRM: Pt calling regarding CPAP equipment to discuss parts and new script. Pt would like a call back at [phone]. Please use [employer] CPAP equipment.

## 2024-04-10 NOTE — Telephone Encounter (Unsigned)
 Copied from CRM #[id number]. Topic: Clinical - Medication Refill >> Apr 10, 2024 12:50 PM Zebedee SAUNDERS wrote: Medication: phentermine  15 MG capsule  Has the patient contacted their pharmacy? Yes (Agent: If no, request that the patient contact the pharmacy for the refill. If patient does not wish to contact the pharmacy document the reason why and proceed with request.) (Agent: If yes, when and what did the pharmacy advise?)Pharmacy need script  This is the patient's preferred pharmacy: [facility] [address] [place], [place] [address] Ph: [phone]  Is this the correct pharmacy for this prescription? Yes If no, delete pharmacy and type the correct one.   Has the prescription been filled recently? No  Is the patient out of the medication? Yes  Has the patient been seen for an appointment in the last year OR does the patient have an upcoming appointment? Yes  Can we respond through MyChart? Yes  Agent: Please be advised that Rx refills may take up to 3 business days. We ask that you follow-up with your pharmacy.

## 2024-04-11 NOTE — Telephone Encounter (Signed)
 Requested medication (s) are due for refill today - provider review   Requested medication (s) are on the active medication list -yes  Future visit scheduled no  Last refill: 07/17/23 #30 5RF  Notes to clinic: non delegated Rx  Requested Prescriptions  Pending Prescriptions Disp Refills   phentermine  15 MG capsule 30 capsule 5    Sig: Take 1 capsule (15 mg total) by mouth every morning.     Not Delegated - Neurology: Anticonvulsants - Controlled - phentermine  hydrochloride Failed - 04/11/2024  2:43 PM      Failed - This refill cannot be delegated      Failed - Valid encounter within last 6 months    Recent Outpatient Visits   None            Failed - Weight completed in the last 3 months    Wt Readings from Last 1 Encounters:  12/01/23 171 lb (77.6 kg)         Passed - eGFR in normal range and within 360 days    GFR calc Af Amer  Date Value Ref Range Status  08/28/2019 77 >59 mL/min/1.73 Final   GFR calc non Af Amer  Date Value Ref Range Status  08/28/2019 67 >59 mL/min/1.73 Final   eGFR  Date Value Ref Range Status  07/17/2023 73 >59 mL/min/1.73 Final         Passed - Cr in normal range and within 360 days    Creatinine, Ser  Date Value Ref Range Status  07/17/2023 0.86 0.57 - 1.00 mg/dL Final         Passed - Last BP in normal range    BP Readings from Last 1 Encounters:  12/01/23 103/68            Requested Prescriptions  Pending Prescriptions Disp Refills   phentermine  15 MG capsule 30 capsule 5    Sig: Take 1 capsule (15 mg total) by mouth every morning.     Not Delegated - Neurology: Anticonvulsants - Controlled - phentermine  hydrochloride Failed - 04/11/2024  2:43 PM      Failed - This refill cannot be delegated      Failed - Valid encounter within last 6 months    Recent Outpatient Visits   None            Failed - Weight completed in the last 3 months    Wt Readings from Last 1 Encounters:  12/01/23 171 lb (77.6 kg)          Passed - eGFR in normal range and within 360 days    GFR calc Af Amer  Date Value Ref Range Status  08/28/2019 77 >59 mL/min/1.73 Final   GFR calc non Af Amer  Date Value Ref Range Status  08/28/2019 67 >59 mL/min/1.73 Final   eGFR  Date Value Ref Range Status  07/17/2023 73 >59 mL/min/1.73 Final         Passed - Cr in normal range and within 360 days    Creatinine, Ser  Date Value Ref Range Status  07/17/2023 0.86 0.57 - 1.00 mg/dL Final         Passed - Last BP in normal range    BP Readings from Last 1 Encounters:  12/01/23 103/68

## 2024-04-11 NOTE — Telephone Encounter (Signed)
 Needs to schedule office visit to discuss CPAP and follow up prediabetes and hypertension

## 2024-04-12 NOTE — Telephone Encounter (Signed)
 Called and spoke to the pt, she has been scheduled to see Dr Gasper 8/25

## 2024-04-15 ENCOUNTER — Ambulatory Visit (INDEPENDENT_AMBULATORY_CARE_PROVIDER_SITE_OTHER): Admitting: Family Medicine

## 2024-04-15 VITALS — BP 135/69 | HR 79 | Ht 62.0 in | Wt 175.0 lb

## 2024-04-15 DIAGNOSIS — R7303 Prediabetes: Secondary | ICD-10-CM

## 2024-04-15 DIAGNOSIS — E669 Obesity, unspecified: Secondary | ICD-10-CM | POA: Diagnosis not present

## 2024-04-15 DIAGNOSIS — I10 Essential (primary) hypertension: Secondary | ICD-10-CM | POA: Diagnosis not present

## 2024-04-15 DIAGNOSIS — G4733 Obstructive sleep apnea (adult) (pediatric): Secondary | ICD-10-CM

## 2024-04-15 MED ORDER — PHENTERMINE HCL 15 MG PO CAPS: 15.0000 mg | ORAL_CAPSULE | ORAL | 4 refills | Status: AC

## 2024-04-15 NOTE — Progress Notes (Signed)
 Established patient visit   Patient: Beverly Bush   DOB: September 07, 1950   73 y.o. Female  MRN: [id number] Visit Date: 04/15/2024  Today's healthcare provider: Nancyann Perry, MD   Chief Complaint  Patient presents with   Sleep Apnea    Patient uses CPAP nightly and appreciates benefit   Hypertension    Patient checks her BP on occasion and states she did check it last week and it was normal.  Does not have reading to give. Patient continues with Amlodipine  and Valsartan  HCTZ   Hyperglycemia    Patient does not check glucose at home.   Subjective    Discussed the use of AI scribe software for clinical note transcription with the patient, who gave verbal consent to proceed.  History of Present Illness   Beverly Bush is a 73 year old female who presents for a checkup and CPAP supply issues.  She has been experiencing difficulties in obtaining replacement parts for her CPAP machine, including a new mask and other supplies that require regular replacement. She has been using a CPAP machine for many years and received her current ResMed Airsense 11 auto machine approximately two to three years ago. She encountered issues with her previous supplier, [employer], and has been informed by her insurance, [employer], that she must now obtain supplies through [employer]. Despite receiving an email with instructions, she has not set up an account with [employer] and has not received any supplies.  She reports that she checks her blood pressure at home and it usually looks pretty good. She is also here to have her blood sugar checked, as it was not done prior to the visit.  Regarding her weight management, she has previously used phentermine  and reports that it helped her lose weight, but she stopped taking it about two months ago and has since gained weight. She may have had a prescription for phentermine  that expired in May. She has not been taking topiramate . She is interested in other weight loss  medications but notes that her insurance does not cover them.     Last CPAP machine was 3 years ago. [employer].  ResMed airsense 11 autoset cpap elite  Medications: Outpatient Medications Prior to Visit  Medication Sig   amLODipine  (NORVASC ) 2.5 MG tablet TAKE 1 TABLET EVERY DAY   Ergocalciferol  (VITAMIN D2) 2000 UNITS TABS Take 1 tablet by mouth daily.   fluticasone  (FLONASE ) 50 MCG/ACT nasal spray SPRAY 2 SPRAYS INTO EACH NOSTRIL EVERY DAY   Multiple Vitamin (MULTIVITAMIN ADULT PO) Take 1 tablet by mouth daily.   rosuvastatin  (CRESTOR ) 5 MG tablet TAKE 1 TABLET EVERY DAY   valsartan -hydrochlorothiazide  (DIOVAN -HCT) 160-25 MG tablet TAKE 1 TABLET EVERY DAY   phentermine  15 MG capsule Take 1 capsule (15 mg total) by mouth every morning.   [DISCONTINUED] topiramate  (TOPAMAX ) 50 MG tablet Take one tablet every evening (Patient not taking: Reported on 04/15/2024)   No facility-administered medications prior to visit.   Review of Systems     Objective    BP 135/69 (BP Location: Right Arm, Patient Position: Sitting, Cuff Size: Normal)   Pulse 79   Ht 5' 2 (1.575 m)   Wt 175 lb (79.4 kg)   SpO2 97%   BMI 32.01 kg/m   Physical Exam   General: Appearance:    Mildly obese female in no acute distress  Eyes:    PERRL, conjunctiva/corneas clear, EOM's intact       Lungs:  Clear to auscultation bilaterally, respirations unlabored  Heart:    Normal heart rate. Normal rhythm. No murmurs, rubs, or gallops.    MS:   All extremities are intact.    Neurologic:   Awake, alert, oriented x 3. No apparent focal neurological defect.         Assessment & Plan       Obstructive sleep apnea Chronic obstructive sleep apnea managed with CPAP therapy. Difficulty obtaining replacement parts due to insurance changes. - Send order for CPAP supplies to [employer] from the supplier regarding processing and delivery of equipment.  Obesity Obesity with recent weight gain after  stopping phentermine . Inquired about Ozempic and Wegovy, not covered by insurance. Phentermine  is affordable as a generic. - Restart phentermine  for weight management. - Advise taking phentermine  mid-morning to optimize appetite suppression. - Schedule follow-up appointment at the end of the year to assess weight and A1c.  Hypertension Hypertension is well-controlled with occasional home monitoring and good readings.  prediabetes  - Check A1c      Return in about 16 weeks (around 08/05/2024) for Hypertension, Yearly Physical.     Nancyann Perry, MD  [facility] [phone] (phone) [phone] (fax)  [facility]

## 2024-04-15 NOTE — Patient Instructions (Signed)
 SABRA  Please review the attached list of medications and notify my office if there are any errors.   . Please bring all of your medications to every appointment so we can make sure that our medication list is the same as yours.

## 2024-04-15 NOTE — Telephone Encounter (Signed)
 Patient has had office visit for sleep apnea and follow up and needs order sent to [employer]. I have order written and signed, have no information about how to send it to [employer]. Please contact [employer] from number in message below to see how to fax order to [employer].

## 2024-05-01 ENCOUNTER — Other Ambulatory Visit: Payer: Self-pay | Admitting: Family Medicine

## 2024-05-01 DIAGNOSIS — I10 Essential (primary) hypertension: Secondary | ICD-10-CM

## 2024-05-27 NOTE — Progress Notes (Signed)
 Beverly Bush                                          MRN: [id number]   05/27/2024   The [facility] Quality Team Specialist reviewed this patient medical record for the purposes of chart review for care gap closure. The following were reviewed: abstraction for care gap closure-controlling blood pressure.    [facility] Quality Team

## 2024-06-24 ENCOUNTER — Telehealth: Payer: Self-pay | Admitting: Family Medicine

## 2024-06-26 ENCOUNTER — Other Ambulatory Visit: Payer: Self-pay

## 2024-06-26 DIAGNOSIS — I10 Essential (primary) hypertension: Secondary | ICD-10-CM

## 2024-06-26 MED ORDER — AMLODIPINE BESYLATE 2.5 MG PO TABS: 2.5000 mg | ORAL_TABLET | Freq: Every day | ORAL | 3 refills | Status: DC

## 2024-07-19 ENCOUNTER — Telehealth: Payer: Self-pay

## 2024-07-29 ENCOUNTER — Other Ambulatory Visit: Payer: Self-pay | Admitting: Family Medicine

## 2024-07-29 DIAGNOSIS — Z1231 Encounter for screening mammogram for malignant neoplasm of breast: Secondary | ICD-10-CM

## 2024-08-21 ENCOUNTER — Ambulatory Visit
Admission: RE | Admit: 2024-08-21 | Discharge: 2024-08-21 | Disposition: A | Discharge status: Home / Self Care | Source: Ambulatory Visit | Attending: Family Medicine | Admitting: Family Medicine

## 2024-08-21 DIAGNOSIS — Z1231 Encounter for screening mammogram for malignant neoplasm of breast: Secondary | ICD-10-CM | POA: Diagnosis present

## 2024-09-27 ENCOUNTER — Ambulatory Visit: Admitting: Family Medicine

## 2024-09-27 ENCOUNTER — Encounter: Payer: Self-pay | Admitting: Family Medicine

## 2024-09-27 VITALS — BP 146/68 | HR 94 | Resp 16 | Ht 62.0 in | Wt 175.0 lb

## 2024-09-27 DIAGNOSIS — I10 Essential (primary) hypertension: Secondary | ICD-10-CM

## 2024-09-27 DIAGNOSIS — E559 Vitamin D deficiency, unspecified: Secondary | ICD-10-CM

## 2024-09-27 DIAGNOSIS — Z23 Encounter for immunization: Secondary | ICD-10-CM

## 2024-09-27 DIAGNOSIS — R7303 Prediabetes: Secondary | ICD-10-CM

## 2024-09-27 DIAGNOSIS — E7800 Pure hypercholesterolemia, unspecified: Secondary | ICD-10-CM

## 2024-09-27 DIAGNOSIS — E669 Obesity, unspecified: Secondary | ICD-10-CM

## 2024-09-27 DIAGNOSIS — F32A Depression, unspecified: Secondary | ICD-10-CM

## 2024-09-27 DIAGNOSIS — M8589 Other specified disorders of bone density and structure, multiple sites: Secondary | ICD-10-CM

## 2024-09-27 DIAGNOSIS — G4733 Obstructive sleep apnea (adult) (pediatric): Secondary | ICD-10-CM

## 2024-09-27 DIAGNOSIS — I491 Atrial premature depolarization: Secondary | ICD-10-CM

## 2024-09-27 MED ORDER — AMLODIPINE BESYLATE 5 MG PO TABS: 5.0000 mg | ORAL_TABLET | Freq: Every day | ORAL | 3 refills | Status: AC

## 2024-10-02 ENCOUNTER — Ambulatory Visit: Payer: Medicare PPO
